# Patient Record
Sex: Male | Born: 1960 | Race: Black or African American | Hispanic: No | Smoking: Never smoker
Health system: Southern US, Community
[De-identification: ages and names within clinical notes are randomized; demographics above are authoritative.]

## PROBLEM LIST (undated history)

## (undated) HISTORY — PX: KNEE ARTHROSCOPY: SUR90

---

## 2011-04-16 ENCOUNTER — Emergency Department (HOSPITAL_COMMUNITY)
Admission: EM | Admit: 2011-04-16 | Discharge: 2011-04-16 | Disposition: A | Discharge status: Home / Self Care | Payer: Non-veteran care | Attending: Emergency Medicine | Admitting: Emergency Medicine

## 2011-04-16 ENCOUNTER — Emergency Department (HOSPITAL_COMMUNITY): Payer: Non-veteran care

## 2011-04-16 DIAGNOSIS — Z9889 Other specified postprocedural states: Secondary | ICD-10-CM | POA: Insufficient documentation

## 2011-04-16 DIAGNOSIS — E119 Type 2 diabetes mellitus without complications: Secondary | ICD-10-CM | POA: Insufficient documentation

## 2011-04-16 DIAGNOSIS — M25559 Pain in unspecified hip: Secondary | ICD-10-CM | POA: Insufficient documentation

## 2011-04-16 DIAGNOSIS — Z7982 Long term (current) use of aspirin: Secondary | ICD-10-CM | POA: Insufficient documentation

## 2011-04-16 DIAGNOSIS — M79609 Pain in unspecified limb: Secondary | ICD-10-CM | POA: Insufficient documentation

## 2011-04-16 DIAGNOSIS — IMO0001 Reserved for inherently not codable concepts without codable children: Secondary | ICD-10-CM | POA: Insufficient documentation

## 2011-04-16 DIAGNOSIS — M79669 Pain in unspecified lower leg: Secondary | ICD-10-CM

## 2011-04-16 DIAGNOSIS — Z79899 Other long term (current) drug therapy: Secondary | ICD-10-CM | POA: Insufficient documentation

## 2011-04-16 LAB — CBC
HCT: 40.3 % (ref 39.0–52.0)
Hemoglobin: 14.1 g/dL (ref 13.0–17.0)
MCH: 31.5 pg (ref 26.0–34.0)
MCV: 90.2 fL (ref 78.0–100.0)
RBC: 4.47 MIL/uL (ref 4.22–5.81)

## 2011-04-16 LAB — DIFFERENTIAL
Eosinophils Absolute: 0.1 10*3/uL (ref 0.0–0.7)
Lymphs Abs: 3.1 10*3/uL (ref 0.7–4.0)
Monocytes Absolute: 0.4 10*3/uL (ref 0.1–1.0)
Monocytes Relative: 6 % (ref 3–12)
Neutrophils Relative %: 49 % (ref 43–77)

## 2011-04-16 LAB — PROTIME-INR: INR: 0.94 (ref 0.00–1.49)

## 2011-04-16 MED ORDER — OXYCODONE-ACETAMINOPHEN 5-325 MG PO TABS
1.0000 | ORAL_TABLET | Freq: Once | ORAL | Status: AC
  Administered 2011-04-16: 1 via ORAL

## 2011-04-16 MED ORDER — OXYCODONE-ACETAMINOPHEN 5-325 MG PO TABS
1.0000 | ORAL_TABLET | Freq: Once | ORAL | Status: AC
  Administered 2011-04-16: 1 via ORAL
  Filled 2011-04-16: qty 1

## 2011-04-16 MED ORDER — OXYCODONE-ACETAMINOPHEN 5-325 MG PO TABS: 1.0000 | ORAL_TABLET | Freq: Four times a day (QID) | ORAL | Status: AC | PRN

## 2011-04-16 MED ORDER — OXYCODONE-ACETAMINOPHEN 5-325 MG PO TABS
ORAL_TABLET | ORAL | Status: AC
  Filled 2011-04-16: qty 1

## 2011-04-16 NOTE — Progress Notes (Signed)
Right lower extremity venous duplex completed at 15:35.  Preliminary report is negative for DVT, SVT, or a Baker's cyst. Smiley Houseman 04/16/2011, 6:34 PM

## 2011-04-16 NOTE — ED Notes (Signed)
Pt speaking loudly using profanity. Asked pt if he spoke to his sig other  This way. Pt apologized for language and more cooperative and less yelling

## 2011-04-16 NOTE — ED Provider Notes (Signed)
History     CSN: [id number] Arrival date & time: 04/16/2011  3:31 PM   First MD Initiated Contact with Patient 04/16/11 1630      Chief Complaint  Patient presents with  . Leg Pain    Leg pain bean about 3 days ago. pt. reports no injury but is swollen    (Consider location/radiation/quality/duration/timing/severity/associated sxs/prior treatment) Leg Pain  The incident occurred 2 days ago. The incident occurred at home. There was no injury mechanism. The pain is present in the right hip and right thigh. The quality of the pain is described as aching. The pain is at a severity of 8/10. The pain has been constant since onset. The symptoms are aggravated by movement and weight bearing.  Patient reports history of surgery on right knee for collateral ligament repair.  Developed pain in right hip and right calf several days ago.  Persistent pain.  Past Medical History  Diagnosis Date  . Diabetes mellitus     Past Surgical History  Procedure Date  . Knee arthroscopy     History reviewed. No pertinent family history.  History  Substance Use Topics  . Smoking status: Never Smoker   . Smokeless tobacco: Not on file  . Alcohol Use: 1.2 oz/week    2 Cans of beer per week      Review of Systems  Musculoskeletal: Positive for myalgias and arthralgias.  All other systems reviewed and are negative.    Allergies  Review of patient's allergies indicates no known allergies.  Home Medications   Current Outpatient Rx  Name Route Sig Dispense Refill  . ASPIRIN 81 MG PO CHEW Oral Chew 81 mg by mouth daily.      Marland Kitchen METFORMIN HCL 500 MG PO TABS Oral Take 500 mg by mouth 2 (two) times daily with a meal.      . MULTI MEGA MINERALS PO TABS Oral Take 1 tablet by mouth daily.      Marland Kitchen SIMVASTATIN 20 MG PO TABS Oral Take 20 mg by mouth at bedtime.        BP 112/72  Pulse 81  Temp(Src) 97.7 F (36.5 C) (Oral)  Resp 20  Ht 6' (1.829 m)  Wt 250 lb (113.399 kg)  BMI 33.91 kg/m2  SpO2  95%  Physical Exam  Constitutional: He is oriented to person, place, and time. He appears well-developed.  HENT:  Head: Normocephalic.  Eyes: Pupils are equal, round, and reactive to light.  Neck: Normal range of motion. Neck supple.  Cardiovascular: Normal rate, regular rhythm, normal heart sounds and intact distal pulses.   Pulmonary/Chest: Effort normal and breath sounds normal.  Abdominal: Soft. Bowel sounds are normal.  Neurological: He is alert and oriented to person, place, and time.  Skin: Skin is warm and dry.    ED Course  Procedures (including critical care time)  Concern for DVT--imaging and lab studies ordered.   Labs Reviewed  CBC  DIFFERENTIAL  PROTIME-INR   No results found.   No diagnosis found.    MDM          Jimmye Norman, NP 04/16/11 [phone]

## 2011-04-16 NOTE — ED Notes (Signed)
Pt able to ambulated from waiting room to cdu 1

## 2011-04-24 NOTE — ED Provider Notes (Signed)
History     CSN: [id number] Arrival date & time: 04/16/2011  3:31 PM   First MD Initiated Contact with Patient 04/16/11 1630      Chief Complaint  Patient presents with  . Leg Pain    Leg pain bean about 3 days ago. pt. reports no injury but is swollen    (Consider location/radiation/quality/duration/timing/severity/associated sxs/prior treatment) HPI  Past Medical History  Diagnosis Date  . Diabetes mellitus     Past Surgical History  Procedure Date  . Knee arthroscopy     History reviewed. No pertinent family history.  History  Substance Use Topics  . Smoking status: Never Smoker   . Smokeless tobacco: Not on file  . Alcohol Use: 1.2 oz/week    2 Cans of beer per week      Review of Systems  Allergies  Review of patient's allergies indicates no known allergies.  Home Medications   Current Outpatient Rx  Name Route Sig Dispense Refill  . ASPIRIN 81 MG PO CHEW Oral Chew 81 mg by mouth daily.      Marland Kitchen METFORMIN HCL 500 MG PO TABS Oral Take 500 mg by mouth 2 (two) times daily with a meal.      . MULTI MEGA MINERALS PO TABS Oral Take 1 tablet by mouth daily.      Marland Kitchen SIMVASTATIN 20 MG PO TABS Oral Take 20 mg by mouth at bedtime.      . OXYCODONE-ACETAMINOPHEN 5-325 MG PO TABS Oral Take 1 tablet by mouth every 6 (six) hours as needed for pain. 20 tablet 0    BP 117/69  Pulse 76  Temp(Src) 98.6 F (37 C) (Oral)  Resp 20  Ht 6' (1.829 m)  Wt 250 lb (113.399 kg)  BMI 33.91 kg/m2  SpO2 95%  Physical Exam  ED Course  Procedures (including critical care time)   Labs Reviewed  CBC  DIFFERENTIAL  PROTIME-INR  LAB REPORT - SCANNED   No results found.   1. Pain, lower leg       MDM           Juliet Rude. Rubin Payor, MD 04/24/11 2007

## 2011-04-24 NOTE — ED Provider Notes (Signed)
Medical screening examination/treatment/procedure(s) were performed by non-physician practitioner and as supervising physician I was immediately available for consultation/collaboration.  Noah Jacobs. Rubin Payor, MD 04/24/11 2017

## 2018-06-22 ENCOUNTER — Emergency Department (HOSPITAL_COMMUNITY): Payer: Self-pay

## 2018-06-22 ENCOUNTER — Other Ambulatory Visit: Payer: Self-pay

## 2018-06-22 ENCOUNTER — Inpatient Hospital Stay (HOSPITAL_COMMUNITY)
Admission: EM | Admit: 2018-06-22 | Discharge: 2018-06-24 | DRG: 303 | Disposition: A | Discharge status: Home / Self Care | Payer: Self-pay | Attending: Internal Medicine | Admitting: Internal Medicine

## 2018-06-22 DIAGNOSIS — Z7984 Long term (current) use of oral hypoglycemic drugs: Secondary | ICD-10-CM

## 2018-06-22 DIAGNOSIS — F419 Anxiety disorder, unspecified: Secondary | ICD-10-CM | POA: Diagnosis present

## 2018-06-22 DIAGNOSIS — F1290 Cannabis use, unspecified, uncomplicated: Secondary | ICD-10-CM | POA: Diagnosis present

## 2018-06-22 DIAGNOSIS — I25110 Atherosclerotic heart disease of native coronary artery with unstable angina pectoris: Principal | ICD-10-CM | POA: Diagnosis present

## 2018-06-22 DIAGNOSIS — E781 Pure hyperglyceridemia: Secondary | ICD-10-CM | POA: Diagnosis present

## 2018-06-22 DIAGNOSIS — Z7982 Long term (current) use of aspirin: Secondary | ICD-10-CM

## 2018-06-22 DIAGNOSIS — R7989 Other specified abnormal findings of blood chemistry: Secondary | ICD-10-CM

## 2018-06-22 DIAGNOSIS — E785 Hyperlipidemia, unspecified: Secondary | ICD-10-CM | POA: Diagnosis present

## 2018-06-22 DIAGNOSIS — E1165 Type 2 diabetes mellitus with hyperglycemia: Secondary | ICD-10-CM | POA: Diagnosis present

## 2018-06-22 DIAGNOSIS — Z8249 Family history of ischemic heart disease and other diseases of the circulatory system: Secondary | ICD-10-CM

## 2018-06-22 DIAGNOSIS — I10 Essential (primary) hypertension: Secondary | ICD-10-CM | POA: Diagnosis present

## 2018-06-22 DIAGNOSIS — Z833 Family history of diabetes mellitus: Secondary | ICD-10-CM

## 2018-06-22 DIAGNOSIS — F1010 Alcohol abuse, uncomplicated: Secondary | ICD-10-CM

## 2018-06-22 DIAGNOSIS — K760 Fatty (change of) liver, not elsewhere classified: Secondary | ICD-10-CM

## 2018-06-22 DIAGNOSIS — E860 Dehydration: Secondary | ICD-10-CM | POA: Diagnosis present

## 2018-06-22 DIAGNOSIS — R739 Hyperglycemia, unspecified: Secondary | ICD-10-CM

## 2018-06-22 DIAGNOSIS — R079 Chest pain, unspecified: Secondary | ICD-10-CM | POA: Diagnosis present

## 2018-06-22 DIAGNOSIS — K219 Gastro-esophageal reflux disease without esophagitis: Secondary | ICD-10-CM | POA: Diagnosis present

## 2018-06-22 DIAGNOSIS — E7800 Pure hypercholesterolemia, unspecified: Secondary | ICD-10-CM | POA: Diagnosis present

## 2018-06-22 DIAGNOSIS — R945 Abnormal results of liver function studies: Secondary | ICD-10-CM

## 2018-06-22 DIAGNOSIS — R0789 Other chest pain: Secondary | ICD-10-CM

## 2018-06-22 LAB — CBG MONITORING, ED
GLUCOSE-CAPILLARY: 337 mg/dL — AB (ref 70–99)
GLUCOSE-CAPILLARY: 312 mg/dL — AB (ref 70–99)
Glucose-Capillary: 292 mg/dL — ABNORMAL HIGH (ref 70–99)

## 2018-06-22 LAB — CBC
HEMATOCRIT: 42.7 % (ref 39.0–52.0)
Hemoglobin: 14.2 g/dL (ref 13.0–17.0)
MCH: 30.9 pg (ref 26.0–34.0)
MCHC: 33.3 g/dL (ref 30.0–36.0)
MCV: 92.8 fL (ref 80.0–100.0)
NRBC: 0 % (ref 0.0–0.2)
Platelets: 231 10*3/uL (ref 150–400)
RBC: 4.6 MIL/uL (ref 4.22–5.81)
RDW: 13.6 % (ref 11.5–15.5)
WBC: 6.4 10*3/uL (ref 4.0–10.5)

## 2018-06-22 LAB — BASIC METABOLIC PANEL
ANION GAP: 14 (ref 5–15)
BUN: 8 mg/dL (ref 6–20)
CALCIUM: 9.2 mg/dL (ref 8.9–10.3)
CO2: 19 mmol/L — ABNORMAL LOW (ref 22–32)
CREATININE: 0.85 mg/dL (ref 0.61–1.24)
Chloride: 98 mmol/L (ref 98–111)
GFR calc non Af Amer: >60 mL/min (ref >60)
GLUCOSE: 351 mg/dL — AB (ref 70–99)
Potassium: 3.6 mmol/L (ref 3.5–5.1)
Sodium: 131 mmol/L — ABNORMAL LOW (ref 135–145)

## 2018-06-22 LAB — LIPASE, BLOOD: Lipase: 32 U/L (ref 11–51)

## 2018-06-22 LAB — LIPID PANEL
Cholesterol: 297 mg/dL — ABNORMAL HIGH (ref 0–200)
HDL: 60 mg/dL (ref >40)
LDL Cholesterol: 202 mg/dL — ABNORMAL HIGH (ref 0–99)
Total CHOL/HDL Ratio: 5 RATIO
Triglycerides: 174 mg/dL — ABNORMAL HIGH (ref <150)
VLDL: 35 mg/dL (ref 0–40)

## 2018-06-22 LAB — URINALYSIS, ROUTINE W REFLEX MICROSCOPIC
Bacteria, UA: NONE SEEN
Bilirubin Urine: NEGATIVE
Hgb urine dipstick: NEGATIVE
KETONES UR: 5 mg/dL — AB
Nitrite: NEGATIVE
PH: 5 (ref 5.0–8.0)
Protein, ur: NEGATIVE mg/dL
SPECIFIC GRAVITY, URINE: 1.015 (ref 1.005–1.030)

## 2018-06-22 LAB — HEPATIC FUNCTION PANEL
ALT: 80 U/L — ABNORMAL HIGH (ref 0–44)
AST: 78 U/L — AB (ref 15–41)
Albumin: 3.7 g/dL (ref 3.5–5.0)
Alkaline Phosphatase: 85 U/L (ref 38–126)
BILIRUBIN DIRECT: 0.2 mg/dL (ref 0.0–0.2)
Indirect Bilirubin: 0.6 mg/dL (ref 0.3–0.9)
Total Bilirubin: 0.8 mg/dL (ref 0.3–1.2)
Total Protein: 7.5 g/dL (ref 6.5–8.1)

## 2018-06-22 LAB — I-STAT TROPONIN, ED: Troponin i, poc: 0 ng/mL (ref 0.00–0.08)

## 2018-06-22 LAB — TROPONIN I: Troponin I: <0.03 ng/mL (ref <0.03)

## 2018-06-22 LAB — GLUCOSE, CAPILLARY
GLUCOSE-CAPILLARY: 241 mg/dL — AB (ref 70–99)
Glucose-Capillary: 260 mg/dL — ABNORMAL HIGH (ref 70–99)

## 2018-06-22 LAB — HEMOGLOBIN A1C
Hgb A1c MFr Bld: 11.2 % — ABNORMAL HIGH (ref 4.8–5.6)
Mean Plasma Glucose: 274.74 mg/dL

## 2018-06-22 MED ORDER — ACETAMINOPHEN 325 MG PO TABS
650.0000 mg | ORAL_TABLET | Freq: Four times a day (QID) | ORAL | Status: DC | PRN
  Administered 2018-06-23 (×2): 650 mg via ORAL
  Filled 2018-06-22 (×2): qty 2

## 2018-06-22 MED ORDER — SIMVASTATIN 20 MG PO TABS
20.0000 mg | ORAL_TABLET | Freq: Every day | ORAL | Status: DC
  Administered 2018-06-22: 20 mg via ORAL
  Filled 2018-06-22: qty 1

## 2018-06-22 MED ORDER — SENNOSIDES-DOCUSATE SODIUM 8.6-50 MG PO TABS: 1.0000 | ORAL_TABLET | Freq: Every evening | ORAL | Status: DC | PRN

## 2018-06-22 MED ORDER — INSULIN ASPART 100 UNIT/ML ~~LOC~~ SOLN
0.0000 [IU] | Freq: Every day | SUBCUTANEOUS | Status: DC
  Administered 2018-06-22 – 2018-06-23 (×2): 3 [IU] via SUBCUTANEOUS

## 2018-06-22 MED ORDER — INSULIN ASPART 100 UNIT/ML ~~LOC~~ SOLN: 0.0000 [IU] | Freq: Three times a day (TID) | SUBCUTANEOUS | Status: DC

## 2018-06-22 MED ORDER — HYDROXYZINE HCL 25 MG PO TABS: 25.0000 mg | ORAL_TABLET | Freq: Four times a day (QID) | ORAL | Status: DC | PRN

## 2018-06-22 MED ORDER — FOLIC ACID 1 MG PO TABS
1.0000 mg | ORAL_TABLET | Freq: Every day | ORAL | Status: DC
  Administered 2018-06-22 – 2018-06-24 (×3): 1 mg via ORAL
  Filled 2018-06-22 (×3): qty 1

## 2018-06-22 MED ORDER — INSULIN ASPART 100 UNIT/ML ~~LOC~~ SOLN
6.0000 [IU] | Freq: Once | SUBCUTANEOUS | Status: AC
  Administered 2018-06-22: 6 [IU] via INTRAVENOUS

## 2018-06-22 MED ORDER — ASPIRIN 81 MG PO CHEW
81.0000 mg | CHEWABLE_TABLET | Freq: Every day | ORAL | Status: DC
  Administered 2018-06-23 – 2018-06-24 (×2): 81 mg via ORAL
  Filled 2018-06-22 (×2): qty 1

## 2018-06-22 MED ORDER — ASPIRIN 81 MG PO CHEW
324.0000 mg | CHEWABLE_TABLET | Freq: Once | ORAL | Status: AC
  Administered 2018-06-22: 324 mg via ORAL
  Filled 2018-06-22: qty 4

## 2018-06-22 MED ORDER — DEXTROSE-NACL 5-0.45 % IV SOLN: INTRAVENOUS | Status: DC

## 2018-06-22 MED ORDER — LORAZEPAM 2 MG/ML IJ SOLN
1.0000 mg | Freq: Once | INTRAMUSCULAR | Status: AC
  Administered 2018-06-22: 1 mg via INTRAVENOUS
  Filled 2018-06-22: qty 1

## 2018-06-22 MED ORDER — INSULIN REGULAR(HUMAN) IN NACL 100-0.9 UT/100ML-% IV SOLN: INTRAVENOUS | Status: DC

## 2018-06-22 MED ORDER — ENOXAPARIN SODIUM 40 MG/0.4ML ~~LOC~~ SOLN
40.0000 mg | SUBCUTANEOUS | Status: DC
  Administered 2018-06-22 – 2018-06-23 (×2): 40 mg via SUBCUTANEOUS
  Filled 2018-06-22 (×3): qty 0.4

## 2018-06-22 MED ORDER — NITROGLYCERIN 0.4 MG SL SUBL
0.4000 mg | SUBLINGUAL_TABLET | SUBLINGUAL | Status: DC | PRN
  Administered 2018-06-22 (×2): 0.4 mg via SUBLINGUAL
  Filled 2018-06-22: qty 1

## 2018-06-22 MED ORDER — LORAZEPAM 1 MG PO TABS
1.0000 mg | ORAL_TABLET | Freq: Four times a day (QID) | ORAL | Status: DC | PRN
  Administered 2018-06-22 – 2018-06-24 (×2): 1 mg via ORAL
  Filled 2018-06-22 (×3): qty 1

## 2018-06-22 MED ORDER — SODIUM CHLORIDE 0.9 % IV SOLN: INTRAVENOUS | Status: DC

## 2018-06-22 MED ORDER — ACETAMINOPHEN 650 MG RE SUPP: 650.0000 mg | Freq: Four times a day (QID) | RECTAL | Status: DC | PRN

## 2018-06-22 MED ORDER — INSULIN ASPART 100 UNIT/ML ~~LOC~~ SOLN: 0.0000 [IU] | Freq: Every day | SUBCUTANEOUS | Status: DC

## 2018-06-22 MED ORDER — POTASSIUM CHLORIDE 10 MEQ/100ML IV SOLN: 10.0000 meq | INTRAVENOUS | Status: DC

## 2018-06-22 MED ORDER — INSULIN GLARGINE 100 UNIT/ML ~~LOC~~ SOLN
10.0000 [IU] | Freq: Every day | SUBCUTANEOUS | Status: DC
  Administered 2018-06-22: 10 [IU] via SUBCUTANEOUS
  Filled 2018-06-22 (×2): qty 0.1

## 2018-06-22 MED ORDER — THIAMINE HCL 100 MG PO TABS: 100.0000 mg | ORAL_TABLET | Freq: Every day | ORAL | Status: DC

## 2018-06-22 MED ORDER — VITAMIN B-1 100 MG PO TABS
100.0000 mg | ORAL_TABLET | Freq: Every day | ORAL | Status: DC
  Administered 2018-06-22 – 2018-06-24 (×3): 100 mg via ORAL
  Filled 2018-06-22 (×3): qty 1

## 2018-06-22 MED ORDER — INSULIN ASPART 100 UNIT/ML ~~LOC~~ SOLN
0.0000 [IU] | Freq: Three times a day (TID) | SUBCUTANEOUS | Status: DC
  Administered 2018-06-22: 8 [IU] via SUBCUTANEOUS
  Administered 2018-06-22 – 2018-06-23 (×2): 5 [IU] via SUBCUTANEOUS
  Administered 2018-06-23: 11 [IU] via SUBCUTANEOUS
  Administered 2018-06-23: 5 [IU] via SUBCUTANEOUS
  Administered 2018-06-24 (×2): 8 [IU] via SUBCUTANEOUS

## 2018-06-22 MED ORDER — SODIUM CHLORIDE 0.9 % IV BOLUS
1000.0000 mL | Freq: Once | INTRAVENOUS | Status: AC
  Administered 2018-06-22: 1000 mL via INTRAVENOUS

## 2018-06-22 NOTE — H&P (Signed)
Date: 06/22/2018               Patient Name:  Noah Jacobs MRN: [id number]  DOB: 09-Oct-1960 Age / Sex: 58 y.o., male   PCP: System, Pcp Not In         Medical Service: Internal Medicine Teaching Service         Attending Physician: Dr. Mikey BussingHoffman, Marthenia RollingErik C, DO    First Contact: Dr. Criss AlvinePrince Pager: [phone]  Second Contact: Dr. Evelene CroonSantos Pager: [phone]       After Hours (After 5p/  First Contact Pager: [phone]  weekends / holidays): Second Contact Pager: [phone]   Chief Complaint: Chest pain  History of Present Illness: Noah Jacobs is a 58 year old male with history of diabetes mellitus, alcohol abuse, hyperlipidemia and hepatic steatosis that [facility]. He states he developed chest pain when he was laying in bed last night. He states the chest pain was localized to the left side of his chest and resolved after several hours. He states that medication given in the ED made the pain better. He denies reproducible pain with movement or exertional angina.  He denies fever/chills, shortness of breath, extremity pain, abdominal pain or leg swelling.   He states he has had chest pains for several years but has not had an ischemic cardiac workup.  He follows with the [facility].  He denies any exertional angina in the past and is able to climb a flight of stairs without chest pain.  Meds: Current Meds  Medication Sig  . aspirin 81 MG chewable tablet Chew 81 mg by mouth daily.    . folic acid (FOLVITE) 1 MG tablet Take 1 mg by mouth daily.  Marland Kitchen. glipiZIDE (GLUCOTROL) 5 MG tablet Take 5 mg by mouth 2 (two) times daily before a meal.  . Multiple Minerals-Vitamins (MULTI MEGA MINERALS) TABS Take 1 tablet by mouth daily.    . simvastatin (ZOCOR) 20 MG tablet Take 20 mg by mouth at bedtime.    . thiamine 100 MG tablet Take 100 mg by mouth daily.  . traMADol (ULTRAM) 50 MG tablet Take 50 mg by mouth every 6 (six) hours as needed for pain.     Allergies: Allergies as of  06/22/2018  . (No Known Allergies)   Past Medical History:  Diagnosis Date  . Diabetes mellitus     Family History:  CAD:  Father, brother and cousins  Social History: Tobacco use: denies ever using Alcohol use:  Would not elaborate but states he drinks on a regular basis. He states his last drink was this morning. He also reports a history of alcohol withdrawal with tremors.  Review of Systems: A complete ROS was negative except as per HPI.   Physical Exam: Blood pressure 112/76, pulse 93, temperature 98.9 F (37.2 C), temperature source Oral, resp. rate 16, height 6' (1.829 m), weight 113.4 kg, SpO2 (!) 89 %. Physical Exam  Constitutional: He is oriented to person, place, and time and well-developed, well-nourished, and in no distress. No distress.  HENT:  Head: Normocephalic and atraumatic.  Eyes: Pupils are equal, round, and reactive to light. Conjunctivae are normal.  Neck: Normal range of motion. Neck supple.  Cardiovascular: Normal rate, regular rhythm and normal heart sounds. Exam reveals no gallop and no friction rub.  No murmur heard. Pulmonary/Chest: Effort normal and breath sounds normal. No respiratory distress. He has no wheezes. He has no rales.  Abdominal:  Soft. Bowel sounds are normal. He exhibits no distension. There is no abdominal tenderness. There is no rebound and no guarding.  Musculoskeletal:        General: No edema.  Neurological: He is alert and oriented to person, place, and time.  Skin: Skin is warm and dry.    EKG: personally reviewed my interpretation is normal sinus rhythm without ischemic changes  CXR: personally reviewed my interpretation is no acute cardio/pulmonary disease  Assessment & Plan by Problem: Principal Problem:   Chest pain Active Problems:   Diabetes mellitus (HCC)   Hepatic steatosis   Elevated LFTs   Alcohol abuse  Atypical chest pain Initial troponin was negative. EKG without ischemic changes. Will trend troponins and  repeat EKG in the morning.  Patient does have a significant family history of coronary artery disease. He states that his father and brother both have had myocardial infarctions.  His risk factors include DM and hyperlipidemia. He has a Heart score of 3.   -Trend troponins x 3 -Repeat EKG in the morning -Nitroglycerin -aspirin  Diabetes mellitus  Patient states that he takes glipizide for glucose control. He states that he randomly checks blood sugars and states that this week they have been trending up. He cannot tell me what his average blood sugar is. Today he is hyperglycemic at 350, bicarbonate is low at 19 and ketones in urine. Anion gap is 14.  He denies infectious symptoms. He is afebrile without leukocytosis.  UA and chest x-ray also without signs of infection.Will give 2 L of fluids and monitor closely.  Not currently in DKA. -DKA protocol - IV NS -hgb A1C  Alcohol abuse Unclear how much patient drinks.  He did not want to elaborate but states he can drink every day or go days without drinking. He states his last drink was this morning and has had history of tremors from not consuming alcohol. -CIWA w/ativan -thiamine/folic acid  Hyperlipidemia -Continue home statin -Lipid profile  Elevated LFTs History of hepatic steatosis noted on CT abdomen/pelvis 12/2017.   Dispo: Admit patient to Observation with expected length of stay less than 2 midnights.  Signed: Camelia Phenes, DO 06/22/2018, 10:31 AM  Pager: [phone]

## 2018-06-22 NOTE — ED Provider Notes (Addendum)
[facility] EMERGENCY DEPARTMENT Provider Note   CSN: [id number] Arrival date & time: 06/22/18  0454     History   Chief Complaint Chief Complaint  Patient presents with  . Chest Pain    HPI Noah Jacobs is a 58 y.o. male.  HPI   58 yo M with h/o DM, HTN, strong family h/o CAD here with CP. Pt reports that for the past 2 weeks, he's noticed that his blood sugar has been going up, along with increased thirst and urination. He admits to being under increased stress but denies other health changes. Over the past 24 hours, he's developed an aching, throbbing, left-sided chest pressure/pain. Unable to further describe. He reports no associated SOB, nausea, diaphoresis. No h/o CAD himself. Pain is not worse w/ exertion, palpation, movement, or breathing. No LE edema. He does not that he had some nausea, vomiting yesterday but o/w none today. No other complaints.  Past Medical History:  Diagnosis Date  . Diabetes mellitus     There are no active problems to display for this patient.   Past Surgical History:  Procedure Laterality Date  . KNEE ARTHROSCOPY          Home Medications    Prior to Admission medications   Medication Sig Start Date End Date Taking? Authorizing Provider  aspirin 81 MG chewable tablet Chew 81 mg by mouth daily.     Yes [provider]  folic acid (FOLVITE) 1 MG tablet Take 1 mg by mouth daily.   Yes [provider]  glipiZIDE (GLUCOTROL) 5 MG tablet Take 5 mg by mouth 2 (two) times daily before a meal.   Yes [provider]  Multiple Minerals-Vitamins (MULTI MEGA MINERALS) TABS Take 1 tablet by mouth daily.     Yes [provider]  simvastatin (ZOCOR) 20 MG tablet Take 20 mg by mouth at bedtime.     Yes [provider]  thiamine 100 MG tablet Take 100 mg by mouth daily.   Yes [provider]  traMADol (ULTRAM) 50 MG tablet Take 50 mg by mouth every 6 (six) hours as needed for pain.    Yes [provider]    Family History No family history on file.  Social History Social History   Tobacco Use  . Smoking status: Never Smoker  Substance Use Topics  . Alcohol use: Yes    Alcohol/week: 2.0 standard drinks    Types: 2 Cans of beer per week  . Drug use: No     Allergies   Patient has no known allergies.   Review of Systems Review of Systems  Constitutional: Positive for fatigue. Negative for chills and fever.  HENT: Negative for congestion and rhinorrhea.   Eyes: Negative for visual disturbance.  Respiratory: Positive for chest tightness. Negative for cough, shortness of breath and wheezing.   Cardiovascular: Positive for chest pain. Negative for leg swelling.  Gastrointestinal: Positive for nausea and vomiting. Negative for abdominal pain and diarrhea.  Genitourinary: Negative for dysuria and flank pain.  Musculoskeletal: Negative for neck pain and neck stiffness.  Skin: Negative for rash and wound.  Allergic/Immunologic: Negative for immunocompromised state.  Neurological: Negative for syncope, weakness and headaches.  All other systems reviewed and are negative.    Physical Exam Updated Vital Signs BP 115/81   Pulse 92   Temp 98.9 F (37.2 C) (Oral)   Resp 18   Ht 6' (1.829 m)   Wt 113.4 kg   SpO2  98%   BMI 33.91 kg/m   Physical Exam Vitals signs and nursing note reviewed.  Constitutional:      General: He is not in acute distress.    Appearance: He is well-developed.  HENT:     Head: Normocephalic and atraumatic.  Eyes:     Conjunctiva/sclera: Conjunctivae normal.  Neck:     Musculoskeletal: Neck supple.  Cardiovascular:     Rate and Rhythm: Normal rate and regular rhythm.     Heart sounds: Normal heart sounds. No murmur. No friction rub.  Pulmonary:     Effort: Pulmonary effort is normal. No respiratory distress.     Breath sounds: Normal breath sounds. No wheezing or rales.  Abdominal:     General: There is no  distension.     Palpations: Abdomen is soft.     Tenderness: There is no abdominal tenderness.  Skin:    General: Skin is warm.     Capillary Refill: Capillary refill takes less than 2 seconds.  Neurological:     Mental Status: He is alert and oriented to person, place, and time.     Motor: No abnormal muscle tone.  Psychiatric:        Mood and Affect: Mood is anxious.      ED Treatments / Results  Labs (all labs ordered are listed, but only abnormal results are displayed) Labs Reviewed  BASIC METABOLIC PANEL - Abnormal; Notable for the following components:      Result Value   Sodium 131 (*)    CO2 19 (*)    Glucose, Bld 351 (*)    All other components within normal limits  URINALYSIS, ROUTINE W REFLEX MICROSCOPIC - Abnormal; Notable for the following components:   Glucose, UA >=500 (*)    Ketones, ur 5 (*)    Leukocytes, UA TRACE (*)    Non Squamous Epithelial 0-5 (*)    All other components within normal limits  CBG MONITORING, ED - Abnormal; Notable for the following components:   Glucose-Capillary 337 (*)    All other components within normal limits  CBC  TROPONIN I  HEPATIC FUNCTION PANEL  LIPASE, BLOOD  BLOOD GAS, VENOUS  I-STAT TROPONIN, ED    EKG EKG Interpretation  Date/Time:  Wednesday June 22 2018 05:03:31 EST Ventricular Rate:  100 PR Interval:  144 QRS Duration: 96 QT Interval:  354 QTC Calculation: 456 R Axis:   77 Text Interpretation:  Normal sinus rhythm Cannot rule out Anterior infarct , age undetermined T wave abnormality, consider inferior ischemia Abnormal ECG No old tracing to compare Confirmed by Dione BoozeGlick, David ([id number]) on 06/22/2018 6:50:26 AM   Radiology Dg Chest 2 View  Result Date: 06/22/2018 CLINICAL DATA:  Shortness of breath and chest pain EXAM: CHEST - 2 VIEW COMPARISON:  None. FINDINGS: Normal heart size and mediastinal contours. Artifact from EKG leads. No acute infiltrate or edema. No effusion or pneumothorax. No acute osseous  findings. IMPRESSION: Negative chest. Electronically Signed   By: Marnee SpringJonathon  Watts M.D.   On: 06/22/2018 05:24    Procedures Procedures (including critical care time)  Medications Ordered in ED Medications  sodium chloride 0.9 % bolus 1,000 mL (1,000 mLs Intravenous New Bag/Given 06/22/18 0837)  nitroGLYCERIN (NITROSTAT) SL tablet 0.4 mg (0.4 mg Sublingual Given 06/22/18 0856)  insulin aspart (novoLOG) injection 6 Units (has no administration in time range)  aspirin chewable tablet 324 mg (324 mg Oral Given 06/22/18 0835)  LORazepam (ATIVAN) injection 1 mg (1 mg Intravenous Given  06/22/18 6060)     Initial Impression / Assessment and Plan / ED Course  I have reviewed the triage vital signs and the nursing notes.  Pertinent labs & imaging results that were available during my care of the patient were reviewed by me and considered in my medical decision making (see chart for details).  Clinical Course as of Jun 22 921  Wed Jun 22, 2018  39 58 yo M here with left-sided CP, with both typical and atypical elements. EKG today does show TWI, with no prior EKG noted. Strong family h/o CAD and pt is obese, h/o DM and hyperlipidemia. Will check repeat trop, give ASA as well as ativan 2/2 underlying anxiety. May need CP rule out. Pain is not c/w dissection. Given his n/v, will add on lipase, LFTs though this is also typical of his anxiety.   [CI]  0846 UA with 5 ketones - likely 2/2 dehydration, doubt DKA and AG is 14. IVF given with plan to repeat CBG, give insulin. HEART score is 5.    [CI]  0853 CP improved with nitro. Admit for high risk CP.   [CI]  0914 BG 337. Will give 0.05 u/kg insulin given that he is not normally on this.   [CI]    Clinical Course User Index [CI] Shaune Pollack, MD     Final Clinical Impressions(s) / ED Diagnoses   Final diagnoses:  Atypical chest pain  Dehydration  Hyperglycemia    ED Discharge Orders    None       Shaune Pollack, MD 06/22/18  0459    Shaune Pollack, MD 06/22/18 [phone]

## 2018-06-22 NOTE — Progress Notes (Signed)
Inpatient Diabetes Program Recommendations  AACE/ADA: New Consensus Statement on Inpatient Glycemic Control  Target Ranges:  Prepandial:   less than 140 mg/dL      Peak postprandial:   less than 180 mg/dL (1-2 hours)      Critically ill patients:  140 - 180 mg/dL   Results for Noah Jacobs, Noah Jacobs (MRN [id number]) as of 06/22/2018 13:55  Ref. Range 06/22/2018 09:11 06/22/2018 10:03 06/22/2018 12:18  Glucose-Capillary Latest Ref Range: 70 - 99 mg/dL 817 (H) 711 (H) 657 (H)   Results for Noah Jacobs, Noah Jacobs (MRN [id number]) as of 06/22/2018 13:55  Ref. Range 06/22/2018 10:18  Hemoglobin A1C Latest Ref Range: 4.8 - 5.6 % 11.2 (H)   Review of Glycemic Control  Diabetes history: DM2 Outpatient Diabetes medications: Glipizide 5 mg BID Current orders for Inpatient glycemic control: Novolog 0-15 units TID with meals, Novolog 0-5 units QHS  Inpatient Diabetes Program Recommendations:  Insulin - Basal: Please consider ordering Lantus 10 units Q24H. HgbA1C: A1C 11.2% on 06/22/18 indicating an average glucose of 275 mg/dl over the past 2-3 months. Patient will need to additional DM medications for outpatient DM control.   Thanks, Orlando Penner, RN, MSN, CDE Diabetes Coordinator Inpatient Diabetes Program [phone] (Team Pager from 8am to 5pm)

## 2018-06-22 NOTE — ED Triage Notes (Signed)
Per pt he woke up with anxiety and chest pain. No SOB no nausea. Pt stated he has anxiety really bad. ALERT ORIENTED X 4

## 2018-06-23 ENCOUNTER — Inpatient Hospital Stay (HOSPITAL_COMMUNITY): Payer: Self-pay

## 2018-06-23 ENCOUNTER — Encounter (HOSPITAL_COMMUNITY): Payer: Self-pay | Admitting: Student

## 2018-06-23 DIAGNOSIS — Z79899 Other long term (current) drug therapy: Secondary | ICD-10-CM

## 2018-06-23 DIAGNOSIS — F1010 Alcohol abuse, uncomplicated: Secondary | ICD-10-CM

## 2018-06-23 DIAGNOSIS — I10 Essential (primary) hypertension: Secondary | ICD-10-CM

## 2018-06-23 DIAGNOSIS — K700 Alcoholic fatty liver: Secondary | ICD-10-CM

## 2018-06-23 DIAGNOSIS — E119 Type 2 diabetes mellitus without complications: Secondary | ICD-10-CM

## 2018-06-23 DIAGNOSIS — Z8249 Family history of ischemic heart disease and other diseases of the circulatory system: Secondary | ICD-10-CM

## 2018-06-23 DIAGNOSIS — R079 Chest pain, unspecified: Secondary | ICD-10-CM

## 2018-06-23 DIAGNOSIS — Z7984 Long term (current) use of oral hypoglycemic drugs: Secondary | ICD-10-CM

## 2018-06-23 DIAGNOSIS — E785 Hyperlipidemia, unspecified: Secondary | ICD-10-CM

## 2018-06-23 LAB — BASIC METABOLIC PANEL
Anion gap: 10 (ref 5–15)
BUN: 10 mg/dL (ref 6–20)
CO2: 22 mmol/L (ref 22–32)
Calcium: 8.9 mg/dL (ref 8.9–10.3)
Chloride: 103 mmol/L (ref 98–111)
Creatinine, Ser: 0.85 mg/dL (ref 0.61–1.24)
Glucose, Bld: 255 mg/dL — ABNORMAL HIGH (ref 70–99)
Potassium: 3.9 mmol/L (ref 3.5–5.1)
SODIUM: 135 mmol/L (ref 135–145)

## 2018-06-23 LAB — GLUCOSE, CAPILLARY
Glucose-Capillary: 250 mg/dL — ABNORMAL HIGH (ref 70–99)
Glucose-Capillary: 215 mg/dL — ABNORMAL HIGH (ref 70–99)
Glucose-Capillary: 314 mg/dL — ABNORMAL HIGH (ref 70–99)
Glucose-Capillary: 252 mg/dL — ABNORMAL HIGH (ref 70–99)

## 2018-06-23 LAB — IRON AND TIBC
IRON: 41 ug/dL — AB (ref 45–182)
Saturation Ratios: 19 % (ref 17.9–39.5)
TIBC: 211 ug/dL — ABNORMAL LOW (ref 250–450)
UIBC: 170 ug/dL

## 2018-06-23 LAB — HIV ANTIBODY (ROUTINE TESTING W REFLEX): HIV Screen 4th Generation wRfx: NONREACTIVE

## 2018-06-23 LAB — FERRITIN: Ferritin: 374 ng/mL — ABNORMAL HIGH (ref 24–336)

## 2018-06-23 MED ORDER — NITROGLYCERIN 0.4 MG SL SUBL
SUBLINGUAL_TABLET | SUBLINGUAL | Status: AC
  Filled 2018-06-23: qty 2

## 2018-06-23 MED ORDER — METOPROLOL TARTRATE 50 MG PO TABS
50.0000 mg | ORAL_TABLET | Freq: Once | ORAL | Status: AC
  Administered 2018-06-23: 50 mg via ORAL
  Filled 2018-06-23: qty 1

## 2018-06-23 MED ORDER — ATORVASTATIN CALCIUM 40 MG PO TABS
40.0000 mg | ORAL_TABLET | Freq: Every day | ORAL | Status: DC
  Administered 2018-06-23: 40 mg via ORAL
  Filled 2018-06-23: qty 1

## 2018-06-23 MED ORDER — SIMVASTATIN 20 MG PO TABS: 40.0000 mg | ORAL_TABLET | Freq: Every day | ORAL | Status: DC

## 2018-06-23 MED ORDER — INSULIN GLARGINE 100 UNIT/ML ~~LOC~~ SOLN
15.0000 [IU] | Freq: Every day | SUBCUTANEOUS | Status: DC
  Administered 2018-06-23 – 2018-06-24 (×2): 15 [IU] via SUBCUTANEOUS
  Filled 2018-06-23 (×2): qty 0.15

## 2018-06-23 MED ORDER — IOPAMIDOL (ISOVUE-370) INJECTION 76%
80.0000 mL | Freq: Once | INTRAVENOUS | Status: AC | PRN
  Administered 2018-06-23: 80 mL via INTRAVENOUS

## 2018-06-23 MED ORDER — NITROGLYCERIN 0.4 MG SL SUBL
0.8000 mg | SUBLINGUAL_TABLET | Freq: Once | SUBLINGUAL | Status: AC
  Administered 2018-06-23: 0.8 mg via SUBLINGUAL

## 2018-06-23 NOTE — Plan of Care (Signed)
  Problem: Clinical Measurements: Goal: Will remain free from infection Outcome: Progressing Note:  No s/s of infection noted. Goal: Respiratory complications will improve Outcome: Progressing Note:  Stable on room air.  No s/s of respiratory complications noted.   Problem: Pain Managment: Goal: General experience of comfort will improve Outcome: Progressing Note:  Denies c/o pain or discomfort.

## 2018-06-23 NOTE — Progress Notes (Addendum)
   Subjective: Noah Jacobs was seen and evaluated on morning rounds. No acute events overnight. His chest pain began yesterday morning he was lying down. Has a difficult time describing the pain. Lasted for several hours and resolved once he took medication in the ED. Also complains of headache since arriving to the ED. Endorses history of similar chest pain episodes prompting presentation to [facility] around a year ago. At that time he had a stress test done but cannot recall the results. Noah Jacobs has a strong family history of cardiac disease. Denies current chest pain or shortness of breath.   Objective:  Vital signs in last 24 hours: Vitals:   06/22/18 1544 06/22/18 2114 06/23/18 0025 06/23/18 0605  BP: 116/77 132/86 (!) 135/99 (!) 128/92  Pulse: 81 69 71 76  Resp:  20 20 18   Temp:  97.9 F (36.6 C) 97.8 F (36.6 C) 98.3 F (36.8 C)  TempSrc:  Oral Oral Oral  SpO2:  100% 100% 98%  Weight:    116.4 kg  Height:       General: awake, alert, sitting up in bed in NAD CV: RRR; no murmurs, rubs or gallops  Pulm: normal respiratory effort; lungs CTA bilaterally  Assessment/Plan:  Principal Problem:   Chest pain Active Problems:   Diabetes mellitus (HCC)   Hepatic steatosis   Elevated LFTs   Alcohol abuse  Noah Jacobs presented with atypical chest pain which resolved with nitroglycerin. He has multiple risk factors include HTN, DM, age, and strong family history. Heart score is 4. Troponins x3 negative. Repeat EKG this morning showed NSR without ischemic changes. Given his elevated cardiovascular risk he will likely benefit from a stress test. He is being seen by cardiology and will wait for their recs prior to placing any further orders.    Atypical Chest Pain: - Continuous cardiac monitoring - Follow-up cardiology recs - Continue Aspirin 81 mg daily - Continue SL nitro PRN chest pain  T2DM: Home med includes glimepiride. HbA1c 11.2. Blood sugar remains elevated despite  Lantus 10 units and SSI.  - Increase Lantus to 15 units QD - Continue SSI moderate  Alcohol Abuse: CIWA scores 3-10 over the last 24 hours. He received 1 mg Ativan yesterday. This morning his CIWA 3. Will continue to monitor with CIWA protocol.  Hyperlipidemia: Lipid panel- Elevated LDL 202, hypertriglyceridemia of 174, elevated cholesterol 297. Will switch home simvastatin to high-intensity atorvastatin 40 mg. Will need to continue monitoring his transaminases as he has elevated AST/ALT from fatty liver disease.  Dispo: Anticipated discharge in approximately 1-3 days.  Synetta Shadow, MD 06/23/2018, 7:46 AM Pager: [phone]

## 2018-06-23 NOTE — Progress Notes (Signed)
Spoke with [facility] about patients coronary CT order  Spoke with patients RN to confirm 18g IV in an [facility] Current HR in the 70's, BP 120 systolic To give the 50mg  metoprolol now and call CT when HR reaches goal of 55-65bpm  Call back with further questions/instructions  Rockwell Alexandria RN Navigator Cardiac Imaging [facility] [phone] Office  [phone] Cell

## 2018-06-23 NOTE — Consult Note (Addendum)
Cardiology Consult    Patient ID: Noah Jacobs MRN: [id number], DOB/AGE: [birth year]   Admit date: 06/22/2018 Date of Consult: 06/23/2018  Primary Physician: Frances Maywood care at the [place]. Primary Cardiologist: New to [facility] (Dr. Harrington Challenger).  Requesting Provider: Lucious Groves, DO.  Patient Profile    Noah Jacobs is a 58 y.o. male with a history of hyperlipidemia, diabetes mellitus, PTSD, anxiety, depression, and bipolar disorder alcohol abuse with hepatic steatosis, who is being seen today for the evaluation of chest pain at the request of Dr. [place].  History of Present Illness    Noah Jacobs is a 58 year old male with no known cardiac history. He does report being hospitalized for chest pain at an outside hospital about 1-2 years ago. He had a stress test at that time which he thinks was normal.   Patient presented to the [facility] ED yesterday for further evaluation of chest pain. He reports non-radiating left sided chest pain the night prior to presentation that he ranks as a 5-6/10. Patient unable to describe pain and states "it just hurt." He reports associated diaphoresis, palpitations, nausea and vomiting with pain but denies any shortness of breath or lightheadedness/dizziness. Nothing seems to make the pain better or worse. Patient denies any orthopnea, PND, or edema. No recent fevers or illnesses. Patient states this pain was similar to the pain he felt prior to his stress test. Patient does states he has had chest pain at rest before probably once a month. Pain denies any exertional chest pain and states he only notices the pain at rest. He never thinks much about it though. Patient was going to ignore this episode of pain but he was with his ex-girlfriend who is an Therapist, sports and she made him come to the ED for further evaluation.   In the ED: EKG showed sinus rhythm with down-sloping ST/ T wave inversions in inferior leads. Troponin negative. Chest x-ray showed no acute findings. CBC unremarkable.  Na 131, K 3.6, Glucose 351, SCr 0.85. AST 78, ALT 80, Alk Phos 85, Total Bili 0.8. Chest pain resolved after receiving Aspirin and Nitroglycerin.  Currently, patient is chest pain free.  Patient denies any previous tobacco use. He does have a history of alcohol abuse and states, "some days I don't drink anything and other days I drink a pint of liquor and some beer." He admits that alcohol is his "struggle." He also reports occasional marijuana use.   Patient has a significant family history of heart disease. His father and his brother both died from "massive heart attacks" at the age of 1. Multiple paternal uncles have pacemakers and a paternal cousin recently died of a "massive heart attack" in his 69's.  Past Medical History   Past Medical History:  Diagnosis Date  . Diabetes mellitus     Past Surgical History:  Procedure Laterality Date  . KNEE ARTHROSCOPY       Allergies  No Known Allergies  Inpatient Medications    . aspirin  81 mg Oral Daily  . enoxaparin (LOVENOX) injection  40 mg Subcutaneous Q24H  . folic acid  1 mg Oral Daily  . insulin aspart  0-15 Units Subcutaneous TID WC  . insulin aspart  0-5 Units Subcutaneous QHS  . insulin glargine  15 Units Subcutaneous Daily  . simvastatin  40 mg Oral QHS  . thiamine  100 mg Oral Daily    Family History    Family History  Problem Relation Age of Onset  .  Diabetes Mother   . CAD Father        died of ""massive heart attack" at the age of 53  . CAD Brother        died of ""massive heart attack" at the age of 104   He indicated that the status of his mother is unknown. He indicated that his father is deceased. He indicated that his brother is deceased.   Social History    Social History   Socioeconomic History  . Marital status: Legally Separated    Spouse name: Not on file  . Number of children: Not on file  . Years of education: Not on file  . Highest education level: Not on file  Occupational History  .  Not on file  Social Needs  . Financial resource strain: Not on file  . Food insecurity:    Worry: Not on file    Inability: Not on file  . Transportation needs:    Medical: Not on file    Non-medical: Not on file  Tobacco Use  . Smoking status: Never Smoker  Substance and Sexual Activity  . Alcohol use: Yes    Alcohol/week: 2.0 standard drinks    Types: 2 Cans of beer per week  . Drug use: No  . Sexual activity: Yes  Lifestyle  . Physical activity:    Days per week: Not on file    Minutes per session: Not on file  . Stress: Not on file  Relationships  . Social connections:    Talks on phone: Not on file    Gets together: Not on file    Attends religious service: Not on file    Active member of club or organization: Not on file    Attends meetings of clubs or organizations: Not on file    Relationship status: Not on file  . Intimate partner violence:    Fear of current or ex partner: Not on file    Emotionally abused: Not on file    Physically abused: Not on file    Forced sexual activity: Not on file  Other Topics Concern  . Not on file  Social History Narrative  . Not on file     Review of Systems    Review of Systems  Constitutional: Positive for diaphoresis. Negative for chills and fever.  HENT: Negative for congestion and sore throat.   Respiratory: Negative for cough, hemoptysis and shortness of breath.   Cardiovascular: Positive for chest pain and palpitations. Negative for orthopnea and leg swelling.  Gastrointestinal: Positive for nausea and vomiting. Negative for blood in stool.  Genitourinary: Negative for hematuria.  Musculoskeletal: Positive for joint pain (chronic left shoulder pain). Negative for myalgias.  Neurological: Negative for dizziness.  Endo/Heme/Allergies: Does not bruise/bleed easily.  Psychiatric/Behavioral: Positive for substance abuse (alcohol and marijuana use).    Physical Exam    Blood pressure (!) 128/92, pulse 76, temperature  98.3 F (36.8 C), temperature source Oral, resp. rate 18, height 6' (1.829 m), weight 116.4 kg, SpO2 98 %.  General: 58 y.o. male resting comfortably in no acute distress. Pleasant and cooperative. HEENT: Normal  Neck: Supple. No carotid bruits or JVD appreciated. Lungs: No increased work of breathing. Clear to auscultation bilaterally. No wheezes, rhonchi, or rales. Heart: RRR. Distinct S1 and S2. No murmurs, gallops, or rubs.  Abdomen: Soft, non-distended, and non-tender to palpation. Bowel sounds present. Extremities: No lower extremity edema. Radial and distal pedal pulses 2+ and equal bilaterally. Skin: Warm  and dry.  Neuro: Alert and oriented x3. No focal deficits. Moves all extremities spontaneously. Psych: Normal affect.  Labs    Troponin [facility] of Care Test) Recent Labs    06/22/18 0514  TROPIPOC 0.00   Recent Labs    06/22/18 0756 06/22/18 1458 06/22/18 2133  TROPONINI <0.03 <0.03 <0.03   Lab Results  Component Value Date   WBC 6.4 06/22/2018   HGB 14.2 06/22/2018   HCT 42.7 06/22/2018   MCV 92.8 06/22/2018   PLT 231 06/22/2018    Recent Labs  Lab 06/22/18 0756 06/23/18 0441  NA  --  135  K  --  3.9  CL  --  103  CO2  --  22  BUN  --  10  CREATININE  --  0.85  CALCIUM  --  8.9  PROT 7.5  --   BILITOT 0.8  --   ALKPHOS 85  --   ALT 80*  --   AST 78*  --   GLUCOSE  --  255*   Lab Results  Component Value Date   CHOL 297 (H) 06/22/2018   HDL 60 06/22/2018   LDLCALC 202 (H) 06/22/2018   TRIG 174 (H) 06/22/2018   No results found for: [facility]   Radiology Studies    Dg Chest 2 View  Result Date: 06/22/2018 CLINICAL DATA:  Shortness of breath and chest pain EXAM: CHEST - 2 VIEW COMPARISON:  None. FINDINGS: Normal heart size and mediastinal contours. Artifact from EKG leads. No acute infiltrate or edema. No effusion or pneumothorax. No acute osseous findings. IMPRESSION: Negative chest. Electronically Signed   By: Monte Fantasia M.D.   On:  06/22/2018 05:24    EKG     EKG: EKG was personally reviewed and demonstrates: Normal sinus rhythm, rate 71 bpm, with slight T wave inversions in lead III. Per chart review in Care Everywhere, inverted T waves were also noted in inferior leads on prior tracing in 12/2017 (unable to see actual tracing, only able to see the report).  Telemetry: Telemetry was personally reviewed and demonstrates: Sinus rhythm with heart rates in the 60's to 90's.   Cardiac Imaging    None.  Assessment & Plan    Chest Pain Concerning for Unstable Angina - Patient presents with non-radiating left-sided chest pain at rest with associated diaphoresis, nausea/vomiting, and palpitations. Patient reports chest pain at rest for the last 1-2 years that may be getting more frequent. - Initial EKG showed down-sloping ST/ T wave inversion in inferior leads. Repeat EKG this morning was improved but still has small T wave inversion in lead III. Per review in Care Everywhere, this was also seen on prior tracing in 12/2017. - Troponin negative x3. - Patient is currently chest pain free.  - Patient has multiple CV risk factor (HLD, DM, gender, race) as well as a significant family history with both his father and brother dying from MI at the age of 38. Suspect we should get coronary CT or cardiac catheterization to visualize coronary arteries. Will discuss with MD.   Hyperlipidemia - Lipid panel this admission: Cholesterol 297, Triglycerides 174, HDL 60, LDL 202. - Currently on Simvastatin '20mg'$  daily. Needs to be on high-intensity statin but AST and ALT are mildly elevated.  - Will defer to MD and primary team.   Otherwise, per primary team.   Signed, Noah Mclean, PA-C 06/23/2018, 9:26 AM  Patient seen and examined  I agree with findings as noted above bty C  Sarajane Jews. Pt is a 58 yo with hx of hyperlipidemia   Presents for CP Pt has had chest pain intermitt for years   On and off   Not associated with any particular  activity   No pleuritic Pt had a stress test a couple years ago at [facility] in [place] (doesn't remember if had nuclear component)     Yesterday the pt was in bed developed CP   Diffuse diaphoresis.   Brought in to ED by friend   Pain eased off with NTG and ASA  None since  Pt is not very active due to multiple musculoskeletal issues     Note family history with several members with CAD   Pt anxoius   Does not want to have MI lke cousin who died   On exam, pt comfortable Neck  JVP is normal   No bruits Lungs are CTA Cardiac RRR   No S3   nO murmurs  Abd is mild diffuse tenderness Ext without edema   2+ pulses   EKG with T wave inversion    By report from [place] he had this in the past   Images not available to compare   Impr:   CP is atypical but last spell was signif   Intense   Associated with diaphoresis    Given HL, and stroong FHx I think pt should have a cardiac CT angiogram to define anatomy      Keep on statin for now    Will give metoprolol assuing poss scan     Dorris Carnes  For questions or updates, please contact   Please consult [email or web address] for contact info under Cardiology/STEMI.

## 2018-06-23 NOTE — Progress Notes (Addendum)
Spoke with patient about his diabetes. States that he has had diabetes "for a long time." States that he was originally on Metformin, but now on glipizide. Has never been on insulin. Is seen by physician at the [facility]. Had an appointment scheduled for 1/22, but was unable to go due to going to the ED.  Noted that his HgbA1C is 11.2%. He states that he knew it was up, but that it has been "4%" before.   Reviewed the significance of A1C and the blood sugar level of 269 mg/dl on an average for 2-3 months period. States that he takes his Glyburide every day. Checks blood sugars sometimes every day, but sometimes just once a week.  Reviewed general diabetes information and foot care. Patient did not seem to be too interested in talking, just eating.  Will continue to monitor blood sugars while in the hospital.  Smith Mince RN BSN CDE Diabetes Coordinator Pager: [phone]  8am-5pm

## 2018-06-23 NOTE — Care Management Note (Addendum)
Case Management Note  Patient Details  Name: Graciela Friedly MRN: [id number] Date of Birth: 1960/10/19  Subjective/Objective:  Pt presented for Chest Pain. Post Stress test. Pt has PCP at the [facility], fax # to office is:  [phone]. CM spoke with Yancy Knoble [place] Fees Coordinator to make them aware that patient is hospitalized.                 Action/Plan: Patient states he gets all medications via the [facility] @ no cost. CM will continue to monitor for additional transition of care needs.   Expected Discharge Date:                  Expected Discharge Plan:  Home/Self Care  In-House Referral:  NA  Discharge planning Services  CM Consult  Post Acute Care Choice:  NA Choice offered to:  NA  DME Arranged:  N/A DME Agency:  NA  HH Arranged:  NA HH Agency:  NA  Status of Service:  Completed, signed off  If discussed at Long Length of Stay Meetings, dates discussed:    Additional Comments:  Gala Lewandowsky, RN 06/23/2018, 2:59 PM

## 2018-06-24 ENCOUNTER — Encounter (HOSPITAL_COMMUNITY): Payer: Self-pay

## 2018-06-24 DIAGNOSIS — R079 Chest pain, unspecified: Secondary | ICD-10-CM

## 2018-06-24 LAB — COMPREHENSIVE METABOLIC PANEL
ALT: 76 U/L — ABNORMAL HIGH (ref 0–44)
AST: 79 U/L — ABNORMAL HIGH (ref 15–41)
Albumin: 3.5 g/dL (ref 3.5–5.0)
Alkaline Phosphatase: 90 U/L (ref 38–126)
Anion gap: 12 (ref 5–15)
BUN: 11 mg/dL (ref 6–20)
CO2: 22 mmol/L (ref 22–32)
Calcium: 9.3 mg/dL (ref 8.9–10.3)
Chloride: 101 mmol/L (ref 98–111)
Creatinine, Ser: 0.8 mg/dL (ref 0.61–1.24)
GFR calc Af Amer: >60 mL/min (ref >60)
Glucose, Bld: 250 mg/dL — ABNORMAL HIGH (ref 70–99)
Potassium: 3.5 mmol/L (ref 3.5–5.1)
Sodium: 135 mmol/L (ref 135–145)
Total Bilirubin: 1.1 mg/dL (ref 0.3–1.2)
Total Protein: 7 g/dL (ref 6.5–8.1)

## 2018-06-24 LAB — HEPATITIS A ANTIBODY, IGM: HEP A IGM: NEGATIVE

## 2018-06-24 LAB — HEPATITIS B CORE ANTIBODY, TOTAL: Hep B Core Total Ab: NEGATIVE

## 2018-06-24 LAB — LIPID PANEL
Cholesterol: 263 mg/dL — ABNORMAL HIGH (ref 0–200)
HDL: 52 mg/dL (ref >40)
LDL CALC: 174 mg/dL — AB (ref 0–99)
Total CHOL/HDL Ratio: 5.1 RATIO
Triglycerides: 183 mg/dL — ABNORMAL HIGH (ref <150)
VLDL: 37 mg/dL (ref 0–40)

## 2018-06-24 LAB — HEPATITIS B CORE ANTIBODY, IGM: Hep B C IgM: NEGATIVE

## 2018-06-24 LAB — HEPATITIS B SURFACE ANTIGEN: Hepatitis B Surface Ag: NEGATIVE

## 2018-06-24 LAB — HEPATITIS C ANTIBODY (REFLEX): HCV Ab: <0.1 s/co ratio (ref 0.0–0.9)

## 2018-06-24 LAB — GLUCOSE, CAPILLARY
Glucose-Capillary: 266 mg/dL — ABNORMAL HIGH (ref 70–99)
Glucose-Capillary: 253 mg/dL — ABNORMAL HIGH (ref 70–99)

## 2018-06-24 LAB — HCV COMMENT:

## 2018-06-24 LAB — HEPATITIS B SURFACE ANTIBODY, QUANTITATIVE: Hep B S AB Quant (Post): <3.1 m[IU]/mL — ABNORMAL LOW (ref >9.9)

## 2018-06-24 MED ORDER — NITROGLYCERIN 0.4 MG SL SUBL: 0.4000 mg | SUBLINGUAL_TABLET | SUBLINGUAL | 0 refills | Status: AC | PRN

## 2018-06-24 MED ORDER — ATORVASTATIN CALCIUM 40 MG PO TABS: 40.0000 mg | ORAL_TABLET | Freq: Every day | ORAL | 0 refills | Status: AC

## 2018-06-24 MED ORDER — METOPROLOL TARTRATE 25 MG PO TABS: 25.0000 mg | ORAL_TABLET | Freq: Two times a day (BID) | ORAL | Status: DC

## 2018-06-24 MED ORDER — METOPROLOL TARTRATE 12.5 MG HALF TABLET
12.5000 mg | ORAL_TABLET | Freq: Two times a day (BID) | ORAL | Status: DC
  Administered 2018-06-24: 12.5 mg via ORAL
  Filled 2018-06-24: qty 1

## 2018-06-24 MED ORDER — ATORVASTATIN CALCIUM 40 MG PO TABS: 40.0000 mg | ORAL_TABLET | Freq: Every day | ORAL | 0 refills | Status: DC

## 2018-06-24 MED ORDER — METOPROLOL TARTRATE 25 MG PO TABS: 25.0000 mg | ORAL_TABLET | Freq: Two times a day (BID) | ORAL | 0 refills | Status: AC

## 2018-06-24 MED ORDER — NITROGLYCERIN 0.4 MG SL SUBL: 0.4000 mg | SUBLINGUAL_TABLET | SUBLINGUAL | 0 refills | Status: DC | PRN

## 2018-06-24 MED ORDER — EMPAGLIFLOZIN 10 MG PO TABS: 10.0000 mg | ORAL_TABLET | Freq: Every day | ORAL | 0 refills | Status: DC

## 2018-06-24 MED ORDER — PANTOPRAZOLE SODIUM 40 MG PO TBEC
40.0000 mg | DELAYED_RELEASE_TABLET | Freq: Every day | ORAL | Status: DC
  Administered 2018-06-24: 40 mg via ORAL
  Filled 2018-06-24: qty 1

## 2018-06-24 MED ORDER — EMPAGLIFLOZIN 10 MG PO TABS: 10.0000 mg | ORAL_TABLET | Freq: Every day | ORAL | 0 refills | Status: AC

## 2018-06-24 MED ORDER — PANTOPRAZOLE SODIUM 40 MG PO TBEC: 40.0000 mg | DELAYED_RELEASE_TABLET | Freq: Every day | ORAL | 0 refills | Status: DC

## 2018-06-24 MED ORDER — METOPROLOL TARTRATE 25 MG PO TABS: 25.0000 mg | ORAL_TABLET | Freq: Two times a day (BID) | ORAL | 0 refills | Status: DC

## 2018-06-24 MED ORDER — ASPIRIN 81 MG PO CHEW: 81.0000 mg | CHEWABLE_TABLET | Freq: Every day | ORAL | 0 refills | Status: AC

## 2018-06-24 MED ORDER — ASPIRIN 81 MG PO CHEW: 81.0000 mg | CHEWABLE_TABLET | Freq: Every day | ORAL | 0 refills | Status: DC

## 2018-06-24 MED ORDER — PANTOPRAZOLE SODIUM 40 MG PO TBEC: 40.0000 mg | DELAYED_RELEASE_TABLET | Freq: Every day | ORAL | 0 refills | Status: AC

## 2018-06-24 NOTE — Discharge Summary (Signed)
Name: Ardeen GarlandGerett Rust MRN: [id number] DOB: [birth year] 58 y.o. PCP: No primary care provider on file.  Date of Admission: 06/22/2018  4:56 AM Date of Discharge: 06/24/2018 Attending Physician: Dr. Carlynn PurlErik Hoffman DO  Discharge Diagnosis: 1. Atypical chest pain 2. Type 2 Diabetes 3. Alcohol Use Disorder 4. Hyperlipidemia 5. Transaminitis  Discharge Medications: Allergies as of 06/24/2018   No Known Allergies     Medication List    STOP taking these medications   glipiZIDE 5 MG tablet Commonly known as:  GLUCOTROL   simvastatin 20 MG tablet Commonly known as:  ZOCOR     TAKE these medications   aspirin 81 MG chewable tablet Chew 1 tablet (81 mg total) by mouth daily for 30 days. Start taking on:  June 25, 2018   atorvastatin 40 MG tablet Commonly known as:  LIPITOR Take 1 tablet (40 mg total) by mouth daily at 6 PM.   empagliflozin 10 MG Tabs tablet Commonly known as:  JARDIANCE Take 10 mg by mouth daily.   folic acid 1 MG tablet Commonly known as:  FOLVITE Take 1 mg by mouth daily.   metoprolol tartrate 25 MG tablet Commonly known as:  LOPRESSOR Take 1 tablet (25 mg total) by mouth 2 (two) times daily.   MULTI MEGA MINERALS Tabs Take 1 tablet by mouth daily.   nitroGLYCERIN 0.4 MG SL tablet Commonly known as:  NITROSTAT Place 1 tablet (0.4 mg total) under the tongue every 5 (five) minutes as needed for chest pain. If you have chest pain after 3 sublingual tablets, please go to the emergency department.   pantoprazole 40 MG tablet Commonly known as:  PROTONIX Take 1 tablet (40 mg total) by mouth daily at 12 noon.   thiamine 100 MG tablet Take 100 mg by mouth daily.   traMADol 50 MG tablet Commonly known as:  ULTRAM Take 50 mg by mouth every 6 (six) hours as needed for pain.       Disposition and follow-up:   Mr.Addis Noah Jacobs was [facility] St[facility].  At the hospital follow up visit please address:  1.   Compliance with medications. Reassess for chest pain symptoms.   2.  Labs / imaging needed at time of follow-up: CMP (at next follow-up visit), lipid panel (6-7 weeks), A1c (3 months)  3.  Pending labs/ test needing follow-up: None  Follow-up Appointments: Follow-up Information    Pricilla Riffleoss, Paula V, MD. Schedule an appointment as soon as possible for a visit.   Specialty:  Cardiology Contact information: [address] [place] [address] [phone]        [facility] Follow [facility].   Why:  Please call to make an appointment for 1-2 weeks.          Hospital Course by problem list: 1. Atypical chest pain: Mr. Noah Jacobs presented with atypical chest pain. He was found to have negative troponins and EKG without ischemic changes.  Cardiology was consulted. Given his significant family history of coronary artery disease, personal history of diabetes, and hyperlipidemia, he was at high risk of cardiovascular event and underwent coronary CT. imaging showed mild CAD in the proximal LAD and RCA and moderate stenosis in the mid RCA.  Cardiology recommended aggressive risk factor modification with continued aspirin and statin.  They also started low-dose beta-blocker (Lopressor 12.5 mg twice daily).  Please ensure that he is taking all of these medications as prescribed.  Prior to  discharge he denied any chest pain and was discharged home in stable condition.  2. Type 2 Diabetes: He was found to have a hemoglobin A1c of 11.2 despite use of glimepiride at home.  He was treated with insulin throughout his hospitalization and did not want to continue insulin as an outpatient due to self-reported hypoglycemic episodes in the past.  Instead glimepiride was discontinued and he was started on Jardiance.  Please ensure that he is taking this medication and reassess for need of insulin.  3. Alcohol Use Disorder: He did not have any signs of active withdrawal during his  hospitalization.  He was counseled on alcohol cessation which he was agreeable to.  4. Hyperlipidemia: Repeat lipid panel in the hospital showed elevated LDL 202, hypertriglyceridemia 174, elevated cholesterol of 297.  His home simvastatin was changed to high intensity atorvastatin 40 mg daily.  He did not have any issues taking this medication and was discharged on atorvastatin.  He will need a repeat lipid panel in 6 to 8 weeks to ensure adequate response to therapy.  5. Transaminitis: He was noted to have elevated AST/ALT (79/76) on admission.  He does have a history of hepatic steatosis noted on a CT abdomen/pelvis from August 2019.  He was recommended to abstain from alcohol use and modify his diet.  Please repeat CMP at hospital follow-up to evaluate AST/ALT while on high-intensity statin.  Discharge Vitals:   BP 121/76   Pulse 85   Temp 97.9 F (36.6 C) (Oral)   Resp 20   Ht 6' (1.829 m)   Wt 116.1 kg   SpO2 99%   BMI 34.72 kg/m   Pertinent Labs, Studies, and Procedures:  CMP Latest Ref Rng & Units 06/24/2018 06/23/2018 06/22/2018  Glucose 70 - 99 mg/dL 970(Y) 637(C) 588(F)  BUN 6 - 20 mg/dL 11 10 8   Creatinine 0.61 - 1.24 mg/dL 0.27 7.41 2.87  Sodium 135 - 145 mmol/L 135 135 131(L)  Potassium 3.5 - 5.1 mmol/L 3.5 3.9 3.6  Chloride 98 - 111 mmol/L 101 103 98  CO2 22 - 32 mmol/L 22 22 19(L)  Calcium 8.9 - 10.3 mg/dL 9.3 8.9 9.2  Total Protein 6.5 - 8.1 g/dL 7.0 - 7.5  Total Bilirubin 0.3 - 1.2 mg/dL 1.1 - 0.8  Alkaline Phos 38 - 126 U/L 90 - 85  AST 15 - 41 U/L 79(H) - 78(H)  ALT 0 - 44 U/L 76(H) - 80(H)    06/22/18 CXR:  IMPRESSION: Negative chest.  06/23/18 CT Coronary:  Aorta:  Normal size.  No calcifications.  No dissection. Aortic Valve:  Trileaflet.  No calcifications. Coronary Arteries:  Normal coronary origin.  Right dominance. RCA is a very large dominant artery that gives rise to PDA and PLA. There is mild to moderate diffuse calcified plaque throughout  the proximal and mid RCA with stenosis 25-50% and a focal stenosis 50-69% in the mid RCA. PDA and PLA have no significant plaque. Left main is a large artery that gives rise to LAD and LCX arteries. Left main has no plaque. LAD is a large vessel that gives rise to two diagonal arteries, there is mild calcified plaque in the proximal LAD with stenosis 25-50%. D1,2 have only minimal plaque. LCX is a non-dominant artery that gives rise to one large OM1 branch. There is minimal plaque. Other findings: Normal pulmonary vein drainage into the left atrium. Normal let atrial appendage without a thrombus. Normal size of the pulmonary artery.  IMPRESSION: 1. Coronary  calcium score of 345. This was 2499 percentile for age and sex matched control. 2. Normal coronary origin with right dominance. 3. 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 Aggressive risk factor modification is recommended.  Discharge Instructions: Discharge Instructions    Call MD for:   Complete by:  As directed    Persistent chest pain that is not relieved with nitroglycerin.   Diet - low sodium heart healthy   Complete by:  As directed    Discharge instructions   Complete by:  As directed    Mr. Noah Jacobs,  Thank you so much for allowing us to care for your during your admission. You were found to have chest pain which is likely due to small blockages in your heart. You did not have a heart attack though. The heart doctors started a couple new medications to decrease your risk of having heart issues in the future. Please make sure that you start taking these on a daily basis. I also started a new diabetes medicine that should help your blood sugar.  Please make an [facility] as soon as possible.  Best, - Dr. Criss AlvinePrince   Increase activity slowly   Complete by:  As directed       Signed: Synetta ShadowPrince, Jamie M, MD 06/24/2018, 1:56 PM   Pager:  [phone]

## 2018-06-24 NOTE — Plan of Care (Signed)
  Problem: Coping: Goal: Level of anxiety will decrease Outcome: Progressing Note:  No s/s of anxiety noted.   Problem: Pain Managment: Goal: General experience of comfort will improve Outcome: Progressing Note:  Denies c/o pain or discomfort.   

## 2018-06-24 NOTE — Progress Notes (Signed)
   Subjective: Noah Jacobs had several episodes of diaphoresis and anxiety last night and this morning.  He states that he usually gets like this when his anxiety is really bad.  He took Ativan this morning which did help with the symptoms.  CIWA at the time was 10.  He is not currently having any of these symptoms.  He does report a minimal amount of chest pain this morning which has since resolved. We discussed the medications he will be discharged to help decrease his risk of cardiovascular events in the future. All questions and concerns were addressed.   Objective:  Vital signs in last 24 hours: Vitals:   06/23/18 1500 06/23/18 1740 06/23/18 2120 06/24/18 0641  BP: 115/76 120/85 108/75 136/88  Pulse: 82 85 76 68  Resp:   18 20  Temp: 98.7 F (37.1 C)  98.4 F (36.9 C) 97.9 F (36.6 C)  TempSrc: Oral  Oral Oral  SpO2: 96%  97% 99%  Weight:    116.1 kg  Height:       General: Lying in bed in no acute distress Cardiovascular: Normal rate, regular rhythm Respiratory: normal work of breathing, no respiratory distress  Assessment/Plan:  Principal Problem:   Chest pain Active Problems:   Type 2 diabetes mellitus with hyperglycemia (HCC)   Hepatic steatosis   Elevated LFTs   Alcohol abuse   Atypical Chest Pain: - appreciate cardiology evaluation; CT angiogram revealed moderate cardiovascular artery disease, but no flow limiting lesions - will treat with medical management including Lopressor, aspirin, and high intensity statin - will continue SL nitro PRN  - component of his chest pain may be related to GERD; will discharge on PPI - Follow-up cardiology recs  T2DM: Home med includes glimepiride. HbA1c 11.2.  - patient states that his diabetes is usually well controlled and should improve now that he plans on alcohol cessation - he is not amenable to insulin due to hypoglycemic episode after taking in the past  - will start on Jardiance at discharge and instructed to follow-up  closely with his PCP   Alcohol Abuse:  - no signs of active withdrawal - counseled on EtOH cessation which is he agreeable to  - would periodically monitor LFTs given fatty liver changes in the setting of heavy alcohol use   Hyperlipidemia: Lipid panel- Elevated LDL 202, hypertriglyceridemia of 174, elevated cholesterol 297.  - statin therapy has been changed to Atorvastatin 40 mg  - will need lipid panel in 6-8 weeks to ensure adequate response to therapy  Dispo: Patient is medically stable for discharge home today.   Synetta Shadow, MD 06/24/2018, 8:23 AM Pager: [phone]

## 2018-06-24 NOTE — Progress Notes (Signed)
Progress Note  Patient Name: Noah Jacobs Date of Encounter: 06/24/2018  Primary Cardiologist: Dietrich Pates, MD (New)  Subjective   No significant overnight events. Patient reports feeling agitated/anxious this morning. He received some Ativan which help. He states he has some mild chest pain this morning that he is not able to describe - he thinks it is related to him being agitated. No shortness of breath.  Inpatient Medications    Scheduled Meds: . aspirin  81 mg Oral Daily  . atorvastatin  40 mg Oral q1800  . enoxaparin (LOVENOX) injection  40 mg Subcutaneous Q24H  . folic acid  1 mg Oral Daily  . insulin aspart  0-15 Units Subcutaneous TID WC  . insulin aspart  0-5 Units Subcutaneous QHS  . insulin glargine  15 Units Subcutaneous Daily  . thiamine  100 mg Oral Daily   Continuous Infusions:  PRN Meds: acetaminophen **OR** acetaminophen, hydrOXYzine, LORazepam, nitroGLYCERIN, senna-docusate   Vital Signs    Vitals:   06/23/18 1500 06/23/18 1740 06/23/18 2120 06/24/18 0641  BP: 115/76 120/85 108/75 136/88  Pulse: 82 85 76 68  Resp:   18 20  Temp: 98.7 F (37.1 C)  98.4 F (36.9 C) 97.9 F (36.6 C)  TempSrc: Oral  Oral Oral  SpO2: 96%  97% 99%  Weight:    116.1 kg  Height:        Intake/Output Summary (Last 24 hours) at 06/24/2018 0923 Last data filed at 06/24/2018 0600 Gross per 24 hour  Intake 480 ml  Output -  Net 480 ml   Last 3 Weights 06/24/2018 06/23/2018 06/22/2018  Weight (lbs) 256 lb 256 lb 9.6 oz 259 lb  Weight (kg) 116.121 kg 116.393 kg 117.482 kg      Telemetry    Sinus rhythm with heart rates in the 60's to 90's. - Personally Reviewed  ECG    No new ECG tracing today. - Personally Reviewed  Physical Exam   GEN: 58 year old male resting comfortably. Alert and in no acute distress.   Neck: Supple. Cardiac: RRR. No murmurs, rubs, or gallops.  Respiratory: Clear to auscultation bilaterally. No wheezes, rhonchi, or rales. GI: Abdomen soft,  non-tender, non-distended. Bowel sounds present. MS: No lower extremity edema. No deformity. Neuro:  No focal deficits. Skin: Warm and dry.  Psych: Normal affect.  Labs    Chemistry Recent Labs  Lab 06/22/18 0507 06/22/18 0756 06/23/18 0441 06/24/18 0418  NA 131*  --  135 135  K 3.6  --  3.9 3.5  CL 98  --  103 101  CO2 19*  --  22 22  GLUCOSE 351*  --  255* 250*  BUN 8  --  10 11  CREATININE 0.85  --  0.85 0.80  CALCIUM 9.2  --  8.9 9.3  PROT  --  7.5  --  7.0  ALBUMIN  --  3.7  --  3.5  AST  --  78*  --  79*  ALT  --  80*  --  76*  ALKPHOS  --  85  --  90  BILITOT  --  0.8  --  1.1  GFRNONAA >60  --  >60 >60  GFRAA >60  --  >60 >60  ANIONGAP 14  --  10 12     Hematology Recent Labs  Lab 06/22/18 0507  WBC 6.4  RBC 4.60  HGB 14.2  HCT 42.7  MCV 92.8  MCH 30.9  MCHC 33.3  RDW 13.6  PLT 231    Cardiac Enzymes Recent Labs  Lab 06/22/18 0756 06/22/18 1458 06/22/18 2133  TROPONINI <0.03 <0.03 <0.03    Recent Labs  Lab 06/22/18 0514  TROPIPOC 0.00     BNPNo results for input(s): BNP, PROBNP in the last 168 hours.   DDimer No results for input(s): DDIMER in the last 168 hours.   Radiology    Ct Coronary Morph W/cta Cor W/score W/ca W/cm &/or Wo/cm  Addendum Date: 06/23/2018   ADDENDUM REPORT: 06/23/2018 16:18 CLINICAL DATA:  58 year old male with hyperlipidemia and chest pain. EXAM: Cardiac/Coronary  CT TECHNIQUE: The patient was scanned on a [employer]. FINDINGS: A 120 kV prospective scan was triggered in the descending thoracic aorta at 111 HU's. Axial non-contrast 3 mm slices were carried out through the heart. The data set was analyzed on a dedicated work station and scored using the Agatson method. Gantry rotation speed was 250 msecs and collimation was .6 mm. No beta blockade and 0.8 mg of sl NTG was given. The 3D data set was reconstructed in 5% intervals of the 67-82 % of the R-R cycle. Diastolic phases were analyzed on a  dedicated work station using MPR, MIP and VRT modes. The patient received 80 cc of contrast. Aorta:  Normal size.  No calcifications.  No dissection. Aortic Valve:  Trileaflet.  No calcifications. Coronary Arteries:  Normal coronary origin.  Right dominance. RCA is a very large dominant artery that gives rise to PDA and PLA. There is mild to moderate diffuse calcified plaque throughout the proximal and mid RCA with stenosis 25-50% and a focal stenosis 50-69% in the mid RCA. PDA and PLA have no significant plaque. Left main is a large artery that gives rise to LAD and LCX arteries. Left main has no plaque. LAD is a large vessel that gives rise to two diagonal arteries, there is mild calcified plaque in the proximal LAD with stenosis 25-50%. D1,2 have only minimal plaque. LCX is a non-dominant artery that gives rise to one large OM1 branch. There is minimal plaque. Other findings: Normal pulmonary vein drainage into the left atrium. Normal let atrial appendage without a thrombus. Normal size of the pulmonary artery. IMPRESSION: 1. Coronary calcium score of 345. This was 69 percentile for age and sex matched control. 2. Normal coronary origin with right dominance. 3. 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 Aggressive risk factor modification is recommended. Electronically Signed   By: Tobias Alexander   On: 06/23/2018 16:18   Result Date: 06/23/2018 EXAM: OVER-READ INTERPRETATION  CT CHEST The following report is an over-read performed by radiologist Dr. Jeronimo Greaves of [facility] on 06/23/2018. This over-read does not include interpretation of cardiac or coronary anatomy or pathology. The coronary CTA interpretation by the cardiologist is attached. COMPARISON:  Chest radiograph 06/22/2018 FINDINGS: Vascular: Normal aortic caliber. No dissection. No central pulmonary embolism, on this non-dedicated study. Mediastinum/Nodes: No imaged thoracic adenopathy. Lungs/Pleura: No  imaged pleural fluid. Clear imaged lungs. Upper Abdomen: Marked hepatic steatosis. Normal imaged portions of the spleen, stomach. Musculoskeletal: No acute osseous abnormality. IMPRESSION: 1. No acute extracardiac findings in the imaged chest. 2. Hepatic steatosis. Electronically Signed: By: Jeronimo Greaves M.D. On: 06/23/2018 14:23    Cardiac Studies   Coronary CT 06/23/2018: Impression: 1. Coronary calcium score of 345. This was 55 percentile for age and sex matched control. 2. Normal coronary origin with right dominance. 3.  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 Aggressive risk factor modification is recommended.  Patient Profile   Mr. Yetta BarreJones is a 58 y.o. male with a history of hyperlipidemia, diabetes mellitus, PTSD, anxiety, depression, and bipolar disorder alcohol abuse with hepatic steatosis, who is being seen today for the evaluation of chest pain at the request of Dr. Mikey BussingHoffman.  Assessment & Plan    Chest Pain Concerning for Unstable Angina - Patient presents with non-radiating left-sided chest pain at rest with associated diaphoresis, nausea/vomiting, and palpitations. Patient reports chest pain at rest for the last 1-2 years that may be getting more frequent. - Initial EKG showed down-sloping ST/ T wave inversion in inferior leads. Repeat EKG this morning was improved but still has small T wave inversion in lead III. Per review in Care Everywhere, this was also seen on prior tracing in 12/2017. - Troponin negative x3.  - Coronary CT showed mild CAD in the proximal LAD and RCA and moderate stenosis in the mid RCA. Coronary calcium score of 345 (99%ile for age and sex matched control). Aggressive risk factor modification recommended. - Continue Aspirin and statin.  - Will add low dose beta-blocker (Lopressor 12.5mg  twice daily).  Hyperlipidemia - Lipid panel this admission: Cholesterol 297, Triglycerides 174, HDL 60, LDL 202. - Patient was on  Simvastatin 20mg  daily at home. He was started on Lipitor 40mg  daily yesterday. Will need to monitor hepatic function closely given mildly elevated AST and ALT.  Otherwise, primary team.   For questions or updates, please contact [facility] Please consult [email or web address] for contact info under    Signed, Corrin ParkerCallie E Goodrich, PA-C  06/24/2018, 9:23 AM   Pt seen and examiend  I agree with findings as noted by Thane Edu Goodrich above  I have reviewed CT angiogram   Pt with mod dz, worse in the RCA   Nothing appears to be flow limiting      He has had CP earlier   In bed   Given ativan  On exam:   Lungs are CTA Cardaic RRR Gr I/VI systolic murmur LLSB   No SS3   Lungs are CTA Abd is benign Ext are without edema  Imp:  Chest pain  Continues to have intermitt pain  ALl episodes have occurred at rest    He has a history of reflux   Worse recnetly He is also anxious   Got valium earlier   WOuld add protonix Would add metoprolol    Decrease adrenergic tone Lipids done this am   Need to optimize lipipds   Treat aggressively Ambulate and follow  Based on CT findings I would not jump in to doing a cath unless fails medical Rx.  Dietrich PatesPaula Aubery Date

## 2018-07-19 ENCOUNTER — Ambulatory Visit: Payer: Non-veteran care | Admitting: Physician Assistant

## 2018-07-19 DIAGNOSIS — E785 Hyperlipidemia, unspecified: Secondary | ICD-10-CM | POA: Insufficient documentation

## 2018-07-19 DIAGNOSIS — I2510 Atherosclerotic heart disease of native coronary artery without angina pectoris: Secondary | ICD-10-CM | POA: Insufficient documentation

## 2018-07-19 NOTE — Progress Notes (Deleted)
Cardiology Office Note:    Date:  07/19/2018   ID:  Noah Jacobs, DOB 1960-07-24, MRN [id number]  PCP:  No primary care provider on file.  Cardiologist:  Dietrich Pates, MD *** Electrophysiologist:  None   Referring MD: No ref. provider found   No chief complaint on file. ***  History of Present Illness:    Noah Jacobs is a 58 y.o. male with hyperlipidemia, diabetes mellitus, PTSD, anxiety, depression, and bipolar disorder alcohol abuse with hepatic steatosis.  He was evaluated by Dr. Tenny Craw during a hospitalization in Jan 2020 for chest pain.  He ruled out for MI.  Coronary CTA demonstrated mild CAD in the proximal LAD and RCA and mod stenosis in the mid RCA.  Ca score is 345.  Medical Rx was recommended.  Dr. Tenny Craw suggested that cath should be avoided unless patient fails medical Rx.  ***  Noah Jacobs ***  Prior CV studies:   The following studies were reviewed today:  *** Coronary CTA 06/23/2018 Coronary Arteries:  Normal coronary origin.  Right dominance. RCA is a very large dominant artery that gives rise to PDA and PLA. There is mild to moderate diffuse calcified plaque throughout the proximal and mid RCA with stenosis 25-50% and a focal stenosis 50-69% in the mid RCA. PDA and PLA have no significant plaque. Left main is a large artery that gives rise to LAD and LCX arteries. Left main has no plaque. LAD is a large vessel that gives rise to two diagonal arteries, there is mild calcified plaque in the proximal LAD with stenosis 25-50%. D1,2 have only minimal plaque. LCX is a non-dominant artery that gives rise to one large OM1 branch. There is minimal plaque. Other findings: Normal pulmonary vein drainage into the left atrium. Normal let atrial appendage without a thrombus. Normal size of the pulmonary artery. IMPRESSION: 1. Coronary calcium score of 345. This was 27 percentile for age and sex matched control. 2. Normal coronary origin with right dominance. 3. Mild CAD in  the proximal LAD and RCA and moderate stenosis in the mid RCA. Aggressive risk factor modification is recommended.    Past Medical History:  Diagnosis Date  . Diabetes mellitus    Surgical Hx: The patient  has a past surgical history that includes Knee arthroscopy.   Current Medications: No outpatient medications have been marked as taking for the 07/19/18 encounter (Appointment) with Tereso Newcomer T, PA-C.     Allergies:   Patient has no known allergies.   Social History   Tobacco Use  . Smoking status: Never Smoker  . Smokeless tobacco: Never Used  Substance Use Topics  . Alcohol use: Yes    Alcohol/week: 2.0 standard drinks    Types: 2 Cans of beer per week  . Drug use: No     Family Hx: The patient's family history includes CAD in his brother and father; Diabetes in his mother.  ROS:   Please see the history of present illness.    ROS All other systems reviewed and are negative.   EKGs/Labs/Other Test Reviewed:    EKG:  EKG is *** ordered today.  The ekg ordered today demonstrates ***  Recent Labs: 06/22/2018: Hemoglobin 14.2; Platelets 231 06/24/2018: ALT 76; BUN 11; Creatinine, Ser 0.80; Potassium 3.5; Sodium 135   Recent Lipid Panel Lab Results  Component Value Date/Time   CHOL 263 (H) 06/24/2018 04:18 AM   TRIG 183 (H) 06/24/2018 04:18 AM   HDL 52 06/24/2018 04:18 AM   CHOLHDL  5.1 06/24/2018 04:18 AM   LDLCALC 174 (H) 06/24/2018 04:18 AM    Physical Exam:    VS:  There were no vitals taken for this visit.    Wt Readings from Last 3 Encounters:  06/24/18 256 lb (116.1 kg)  04/16/11 250 lb (113.4 kg)     ***Physical Exam  ASSESSMENT & PLAN:    No diagnosis found.***  Dispo:  No follow-ups on file.   Medication Adjustments/Labs and Tests Ordered: Current medicines are reviewed at length with the patient today.  Concerns regarding medicines are outlined above.  Tests Ordered: No orders of the defined types were placed in this  encounter.  Medication Changes: No orders of the defined types were placed in this encounter.   Signed, Tereso Newcomer, PA-C  07/19/2018 8:13 AM    [facility] [address] [place], [place], [place]  [address] Phone: [phone]; Fax: [phone]

## 2018-07-20 ENCOUNTER — Encounter: Payer: Self-pay | Admitting: Physician Assistant

## 2020-01-16 IMAGING — CT CT HEART MORP W/ CTA COR W/ SCORE W/ CA W/CM &/OR W/O CM
4 of 7 series · 8 of 20 positions shown, 9 images · non-contrast
Comparison: Chest radiograph 06/22/2018

Addendum:
EXAM:
OVER-READ INTERPRETATION  CT CHEST

The following report is an over-read performed by radiologist Dr.
Tayo Ares [REDACTED] on 06/23/2018. This over-read
does not include interpretation of cardiac or coronary anatomy or
pathology. The coronary CTA interpretation by the cardiologist is
attached.
CLINICAL DATA: 57-year-old female with hyperlipidemia and chest
pain.
Cardiac/Coronary  CT
TECHNIQUE: The patient was scanned on a [employer] scanner.

[Series 6: best diast 71 % · axial · 0.35mm/px · z∈[+1243,+1284]mm · 2 of 313 slices shown, 3 images]
[im 105/313  vessel]
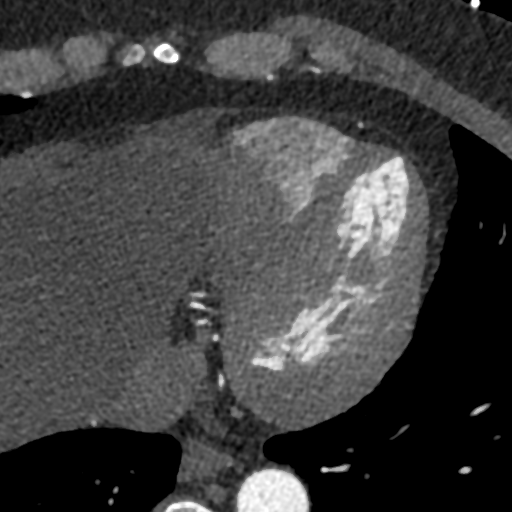
[im 105/313  lung]
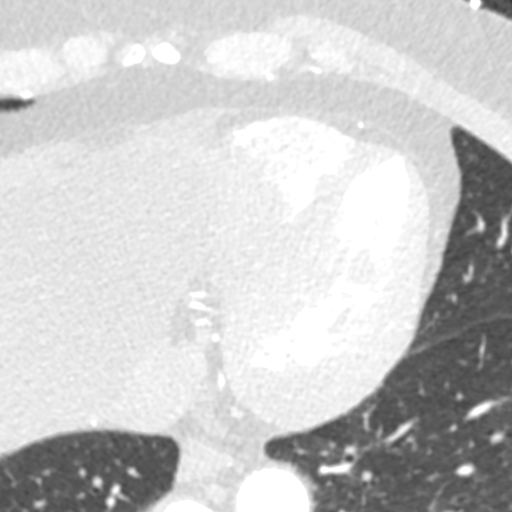
[im 209/313  vessel]
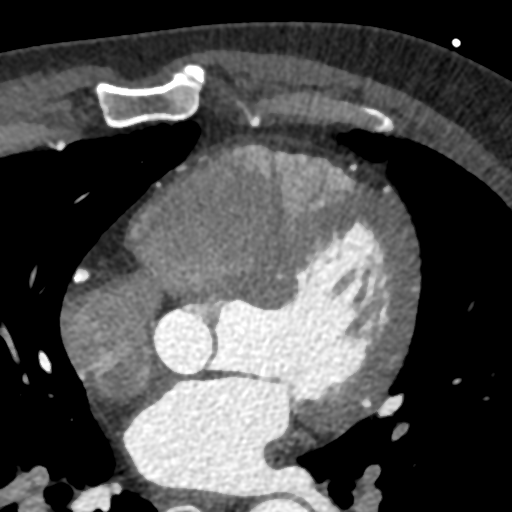

[Series 7: best syst 33 % · axial · 0.35mm/px · z∈[+1243,+1284]mm · 2 of 313 slices shown]
[im 105/313  vessel]
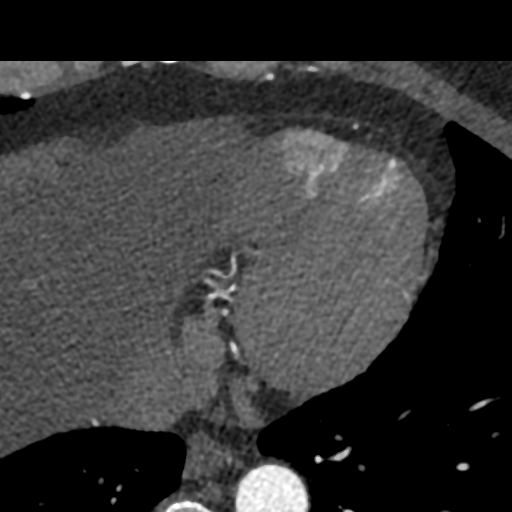
[im 209/313  vessel]
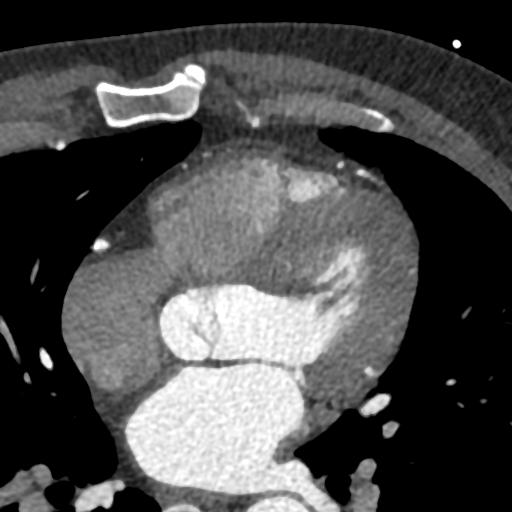

[Series 8: ts diast sharp 71 % · axial · 0.35mm/px · z∈[+1243,+1284]mm · 2 of 313 slices shown]
[im 105/313  lung]
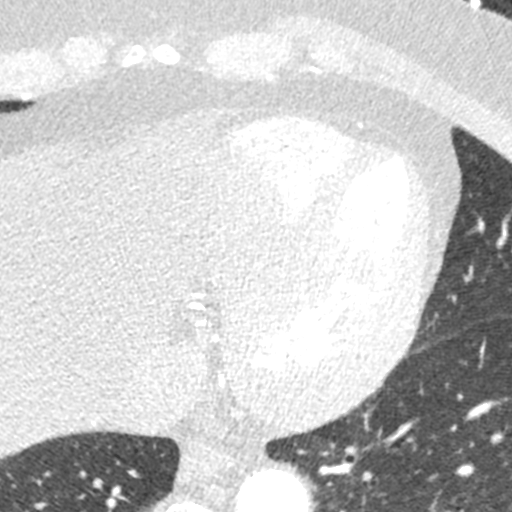
[im 209/313  lung]
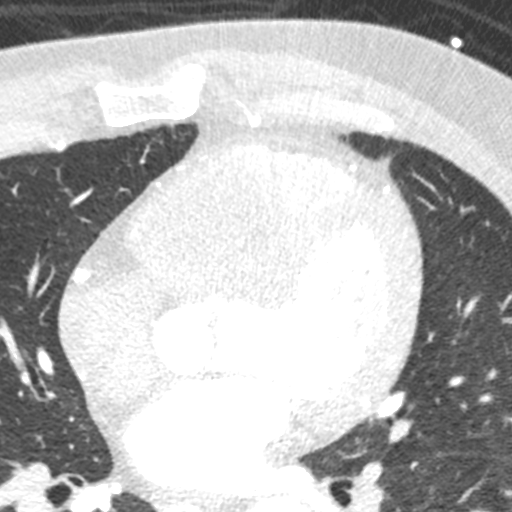

[Series 9: ts syst sharp 33 % · axial · 0.35mm/px · z∈[+1243,+1284]mm · 2 of 313 slices shown]
[im 105/313  lung]
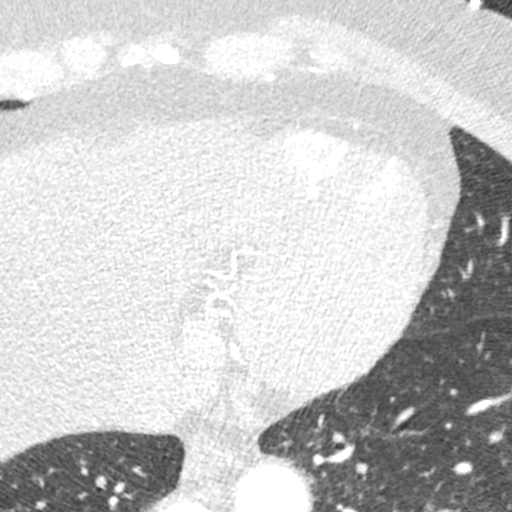
[im 209/313  lung]
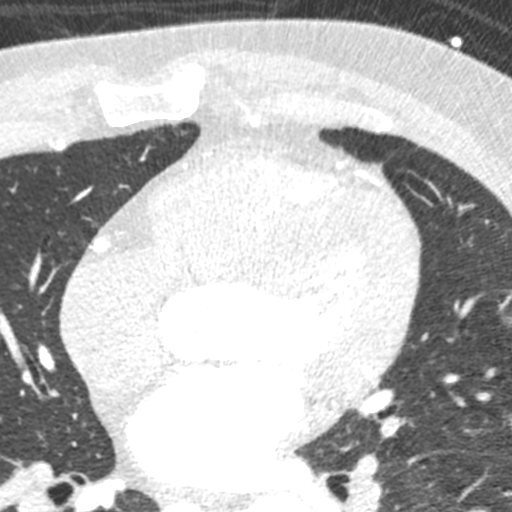

[8 of 20 positions shown; findings below may reference images not displayed]

FINDINGS: Vascular: Normal aortic caliber. No dissection. No central pulmonary
embolism, on this non-dedicated study.

Mediastinum/Nodes: No imaged thoracic adenopathy.

Lungs/Pleura: No imaged pleural fluid. Clear imaged lungs.

Upper Abdomen: Marked hepatic steatosis. Normal imaged portions of
the spleen, stomach.

Musculoskeletal: No acute osseous abnormality.
IMPRESSION: 1. No acute extracardiac findings in the imaged chest.
2. Hepatic steatosis.
FINDINGS: A 120 kV prospective scan was triggered in the descending thoracic
aorta at 111 HU's. Axial non-contrast 3 mm slices were carried out
through the heart. The data set was analyzed on a dedicated work
station and scored using the Agatson method. Gantry rotation speed
was 250 msecs and collimation was .6 mm. No beta blockade and 0.8 mg
of sl NTG was given. The 3D data set was reconstructed in 5%
intervals of the 67-82 % of the R-R cycle. Diastolic phases were
analyzed on a dedicated work station using MPR, MIP and VRT modes.
The patient received 80 cc of contrast.

Aorta:  Normal size.  No calcifications.  No dissection.

Aortic Valve:  Trileaflet.  No calcifications.

Coronary Arteries:  Normal coronary origin.  Right dominance.

RCA is a very large dominant artery that gives rise to PDA and PLA.
There is mild to moderate diffuse calcified plaque throughout the
proximal and mid RCA with stenosis 25-50% and a focal stenosis
50-69% in the mid RCA.

PDA and PLA have no significant plaque.

Left main is a large artery that gives rise to LAD and LCX arteries.
Left main has no plaque.

LAD is a large vessel that gives rise to two diagonal arteries,
there is mild calcified plaque in the proximal LAD with stenosis
25-50%.

D1,2 have only minimal plaque.

LCX is a non-dominant artery that gives rise to one large OM1
branch. There is minimal plaque.

Other findings:

Normal pulmonary vein drainage into the left atrium.

Normal let atrial appendage without a thrombus.

Normal size of the pulmonary artery.
IMPRESSION: 1. Coronary calcium score of 345. This was 99 percentile for age and
sex matched control.

2. Normal coronary origin with right dominance.

3. Mild CAD in the proximal LAD and RCA and moderate stenosis in the
mid RCA. Aggressive risk factor modification is recommended.

*** End of Addendum ***
